# Patient Record
Sex: Male | Born: 1955 | Race: Black or African American | Hispanic: No | Marital: Single | Smoking: Current every day smoker
Health system: Southern US, Community
[De-identification: ages and names within clinical notes are randomized; demographics above are authoritative.]

## PROBLEM LIST (undated history)

## (undated) DIAGNOSIS — R51 Headache: Secondary | ICD-10-CM

## (undated) DIAGNOSIS — R519 Headache, unspecified: Secondary | ICD-10-CM

---

## 2015-02-26 ENCOUNTER — Inpatient Hospital Stay
Admission: AD | Admit: 2015-02-26 | Discharge: 2015-03-05 | DRG: 871 | Payer: Self-pay | Source: Ambulatory Visit | Attending: Internal Medicine | Admitting: Internal Medicine

## 2015-02-26 ENCOUNTER — Ambulatory Visit
Admit: 2015-02-26 | Discharge: 2015-02-26 | Disposition: A | Discharge status: Home / Self Care | Payer: Medicaid Other | Attending: *Deleted | Admitting: *Deleted

## 2015-02-26 ENCOUNTER — Ambulatory Visit
Admission: EM | Admit: 2015-02-26 | Discharge: 2015-02-26 | Disposition: A | Discharge status: Home / Self Care | Payer: Medicaid Other | Attending: Family Medicine | Admitting: Family Medicine

## 2015-02-26 ENCOUNTER — Ambulatory Visit: Payer: Medicaid Other

## 2015-02-26 DIAGNOSIS — Z79899 Other long term (current) drug therapy: Secondary | ICD-10-CM

## 2015-02-26 DIAGNOSIS — E871 Hypo-osmolality and hyponatremia: Secondary | ICD-10-CM | POA: Diagnosis present

## 2015-02-26 DIAGNOSIS — Z6825 Body mass index (BMI) 25.0-25.9, adult: Secondary | ICD-10-CM

## 2015-02-26 DIAGNOSIS — F17210 Nicotine dependence, cigarettes, uncomplicated: Secondary | ICD-10-CM | POA: Insufficient documentation

## 2015-02-26 DIAGNOSIS — M1612 Unilateral primary osteoarthritis, left hip: Secondary | ICD-10-CM | POA: Diagnosis present

## 2015-02-26 DIAGNOSIS — T148XXA Other injury of unspecified body region, initial encounter: Secondary | ICD-10-CM

## 2015-02-26 DIAGNOSIS — X58XXXA Exposure to other specified factors, initial encounter: Secondary | ICD-10-CM | POA: Insufficient documentation

## 2015-02-26 DIAGNOSIS — Z95828 Presence of other vascular implants and grafts: Secondary | ICD-10-CM

## 2015-02-26 DIAGNOSIS — T148 Other injury of unspecified body region: Secondary | ICD-10-CM

## 2015-02-26 DIAGNOSIS — R1902 Left upper quadrant abdominal swelling, mass and lump: Secondary | ICD-10-CM

## 2015-02-26 DIAGNOSIS — S29011A Strain of muscle and tendon of front wall of thorax, initial encounter: Secondary | ICD-10-CM | POA: Insufficient documentation

## 2015-02-26 DIAGNOSIS — Z79891 Long term (current) use of opiate analgesic: Secondary | ICD-10-CM

## 2015-02-26 DIAGNOSIS — E43 Unspecified severe protein-calorie malnutrition: Secondary | ICD-10-CM | POA: Diagnosis present

## 2015-02-26 DIAGNOSIS — D72829 Elevated white blood cell count, unspecified: Secondary | ICD-10-CM

## 2015-02-26 DIAGNOSIS — G43909 Migraine, unspecified, not intractable, without status migrainosus: Secondary | ICD-10-CM | POA: Diagnosis present

## 2015-02-26 DIAGNOSIS — A4151 Sepsis due to Escherichia coli [E. coli]: Principal | ICD-10-CM | POA: Diagnosis present

## 2015-02-26 DIAGNOSIS — Z452 Encounter for adjustment and management of vascular access device: Secondary | ICD-10-CM

## 2015-02-26 DIAGNOSIS — R634 Abnormal weight loss: Secondary | ICD-10-CM | POA: Diagnosis present

## 2015-02-26 DIAGNOSIS — K651 Peritoneal abscess: Secondary | ICD-10-CM | POA: Diagnosis present

## 2015-02-26 DIAGNOSIS — F1020 Alcohol dependence, uncomplicated: Secondary | ICD-10-CM | POA: Diagnosis present

## 2015-02-26 DIAGNOSIS — I499 Cardiac arrhythmia, unspecified: Secondary | ICD-10-CM | POA: Diagnosis present

## 2015-02-26 DIAGNOSIS — Z791 Long term (current) use of non-steroidal anti-inflammatories (NSAID): Secondary | ICD-10-CM

## 2015-02-26 DIAGNOSIS — E860 Dehydration: Secondary | ICD-10-CM | POA: Diagnosis present

## 2015-02-26 DIAGNOSIS — D649 Anemia, unspecified: Secondary | ICD-10-CM | POA: Diagnosis present

## 2015-02-26 DIAGNOSIS — K862 Cyst of pancreas: Secondary | ICD-10-CM | POA: Diagnosis present

## 2015-02-26 DIAGNOSIS — R1900 Intra-abdominal and pelvic swelling, mass and lump, unspecified site: Secondary | ICD-10-CM | POA: Diagnosis present

## 2015-02-26 DIAGNOSIS — R109 Unspecified abdominal pain: Secondary | ICD-10-CM | POA: Insufficient documentation

## 2015-02-26 HISTORY — DX: Headache: R51

## 2015-02-26 HISTORY — DX: Headache, unspecified: R51.9

## 2015-02-26 LAB — CBC WITH DIFFERENTIAL/PLATELET
Basophils Absolute: 0 10*3/uL (ref 0–0.1)
Basophils Relative: 0 %
EOS ABS: 0 10*3/uL (ref 0–0.7)
EOS PCT: 0 %
HCT: 37.9 % — ABNORMAL LOW (ref 40.0–52.0)
Hemoglobin: 12.5 g/dL — ABNORMAL LOW (ref 13.0–18.0)
LYMPHS ABS: 0.7 10*3/uL — AB (ref 1.0–3.6)
LYMPHS PCT: 4 %
MCH: 32.7 pg (ref 26.0–34.0)
MCHC: 33 g/dL (ref 32.0–36.0)
MCV: 99.2 fL (ref 80.0–100.0)
MONO ABS: 1.5 10*3/uL — AB (ref 0.2–1.0)
Monocytes Relative: 9 %
Neutro Abs: 14.9 10*3/uL — ABNORMAL HIGH (ref 1.4–6.5)
Neutrophils Relative %: 87 %
PLATELETS: 455 10*3/uL — AB (ref 150–440)
RBC: 3.82 MIL/uL — ABNORMAL LOW (ref 4.40–5.90)
RDW: 13.1 % (ref 11.5–14.5)
WBC: 17.2 10*3/uL — ABNORMAL HIGH (ref 3.8–10.6)

## 2015-02-26 LAB — URINALYSIS COMPLETE WITH MICROSCOPIC (ARMC ONLY)
Bacteria, UA: NONE SEEN — AB
PH: 0 — AB (ref 5.0–8.0)

## 2015-02-26 LAB — COMPREHENSIVE METABOLIC PANEL
ALT: 16 U/L — ABNORMAL LOW (ref 17–63)
ANION GAP: 13 (ref 5–15)
AST: 24 U/L (ref 15–41)
Albumin: 3.1 g/dL — ABNORMAL LOW (ref 3.5–5.0)
Alkaline Phosphatase: 90 U/L (ref 38–126)
BUN: 9 mg/dL (ref 6–20)
CHLORIDE: 91 mmol/L — AB (ref 101–111)
CO2: 27 mmol/L (ref 22–32)
CREATININE: 0.92 mg/dL (ref 0.61–1.24)
Calcium: 9.5 mg/dL (ref 8.9–10.3)
Glucose, Bld: 144 mg/dL — ABNORMAL HIGH (ref 65–99)
POTASSIUM: 4.4 mmol/L (ref 3.5–5.1)
Sodium: 131 mmol/L — ABNORMAL LOW (ref 135–145)
Total Bilirubin: 1.8 mg/dL — ABNORMAL HIGH (ref 0.3–1.2)
Total Protein: 7.2 g/dL (ref 6.5–8.1)

## 2015-02-26 LAB — LIPASE, BLOOD: LIPASE: 55 U/L — AB (ref 22–51)

## 2015-02-26 MED ORDER — IOHEXOL 300 MG/ML  SOLN
100.0000 mL | Freq: Once | INTRAMUSCULAR | Status: AC | PRN
  Administered 2015-02-26: 100 mL via INTRAVENOUS

## 2015-02-26 MED ORDER — SODIUM CHLORIDE 0.9 % IV SOLN
INTRAVENOUS | Status: DC
  Administered 2015-02-26 – 2015-03-01 (×7): via INTRAVENOUS

## 2015-02-26 MED ORDER — ONDANSETRON HCL 4 MG PO TABS: 4.0000 mg | ORAL_TABLET | Freq: Four times a day (QID) | ORAL | Status: DC | PRN

## 2015-02-26 MED ORDER — MORPHINE SULFATE (PF) 4 MG/ML IV SOLN
4.0000 mg | INTRAVENOUS | Status: DC | PRN
  Administered 2015-02-27 – 2015-03-03 (×11): 4 mg via INTRAVENOUS
  Filled 2015-02-26 (×11): qty 1

## 2015-02-26 MED ORDER — PIPERACILLIN-TAZOBACTAM 3.375 G IVPB
3.3750 g | Freq: Three times a day (TID) | INTRAVENOUS | Status: DC
  Administered 2015-02-26 – 2015-03-02 (×12): 3.375 g via INTRAVENOUS
  Filled 2015-02-26 (×17): qty 50

## 2015-02-26 MED ORDER — OXYCODONE-ACETAMINOPHEN 5-325 MG PO TABS: 1.0000 | ORAL_TABLET | Freq: Three times a day (TID) | ORAL | Status: AC | PRN

## 2015-02-26 MED ORDER — HEPARIN SODIUM (PORCINE) 5000 UNIT/ML IJ SOLN
5000.0000 [IU] | Freq: Three times a day (TID) | INTRAMUSCULAR | Status: AC
  Administered 2015-02-26 – 2015-03-02 (×12): 5000 [IU] via SUBCUTANEOUS
  Filled 2015-02-26 (×12): qty 1

## 2015-02-26 MED ORDER — PANTOPRAZOLE SODIUM 40 MG IV SOLR
40.0000 mg | Freq: Two times a day (BID) | INTRAVENOUS | Status: DC
Start: 2015-02-26 — End: 2015-03-01
  Administered 2015-02-26 – 2015-03-01 (×6): 40 mg via INTRAVENOUS
  Filled 2015-02-26 (×6): qty 40

## 2015-02-26 MED ORDER — ONDANSETRON HCL 4 MG/2ML IJ SOLN: 4.0000 mg | Freq: Four times a day (QID) | INTRAMUSCULAR | Status: DC | PRN

## 2015-02-26 MED ORDER — PIPERACILLIN-TAZOBACTAM 3.375 G IVPB 30 MIN: 3.3750 g | Freq: Three times a day (TID) | INTRAVENOUS | Status: DC
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
## 2015-02-26 NOTE — Consult Note (Signed)
Patient ID: William Marquez, male   DOB: 1956/01/02, 59 y.o.   MRN: [id number]  HPI William Marquez is a 59 y.o. male admitted with 2-4 weeks of abdominal pain.  HPI he was in his usual state of good health until approximately 4 weeks ago when he began to develop some mild anorexia early satiety and left upper quadrant abdominal pain. He denies any nausea or vomiting. He does not complaints of fever or chills. He did have some mild diarrhea which resolved without intervention. He presented to the urgent care facility where CT scan was recommended. Scan identified a significant left upper quadrant mass and patient was referred to the hospital for direct admission. He's been seen by gastroenterology and internal medicine.  He does have a reasonably significant drinking history. He's not had any previous abdominal surgery. He denies any history of hepatitis yellow jaundice pancreatitis peptic ulcer disease gallbladder disease or diverticulitis. He does take nonsteroidal medications regularly. He's never had an endoscopy. He does not have a regular family practice doctor and has not been seen in a long time. He's never had a colonoscopy.  History reviewed. No pertinent past medical history.  History reviewed. No pertinent past surgical history.  History reviewed. No pertinent family history.  Social History Social History  Substance Use Topics  . Smoking status: Current Every Day Smoker  . Smokeless tobacco: None  . Alcohol Use: Yes    No Known Allergies  Current Facility-Administered Medications  Medication Dose Route Frequency Provider Last Rate Last Dose  . 0.9 %  sodium chloride infusion   Intravenous Continuous Katharina Caper, MD 125 mL/hr at 02/26/15 1727    . heparin injection 5,000 Units  5,000 Units Subcutaneous 3 times per day Katharina Caper, MD   5,000 Units at 02/26/15 1726  . morphine 4 MG/ML injection 4 mg  4 mg Intravenous Q4H PRN Katharina Caper, MD      . ondansetron (ZOFRAN) tablet 4  mg  4 mg Oral Q6H PRN Katharina Caper, MD       Or  . ondansetron (ZOFRAN) injection 4 mg  4 mg Intravenous Q6H PRN Katharina Caper, MD      . pantoprazole (PROTONIX) injection 40 mg  40 mg Intravenous Q12H Katharina Caper, MD      . piperacillin-tazobactam (ZOSYN) IVPB 3.375 g  3.375 g Intravenous 3 times per day Katharina Caper, MD   3.375 g at 02/26/15 1808     Review of Systems A 10 point review of systems was asked and was negative except for the following positive findings: Findings noted above.  Physical Exam Blood pressure 146/69, pulse 94, temperature 98.3 F (36.8 C), temperature source Oral, resp. rate 19, height  (1.753 m), weight 78.019 kg (172 lb), SpO2 98 %. CONSTITUTIONAL: He appears comfortable at rest with no significant complaints. He does not have any pain at the present time.Marland Kitchen EYES: Pupils are equal, round, and reactive to light, Sclera are non-icteric. EARS, NOSE, MOUTH AND THROAT: The oropharynx is clear. The oral mucosa is pink and moist. Hearing is intact to voice. LYMPH NODES:  Lymph nodes in the neck are normal. RESPIRATORY:  Lungs are clear. There is normal respiratory effort, with equal breath sounds bilaterally, and without pathologic use of accessory muscles. CARDIOVASCULAR: Heart is regular without murmurs, gallops, or rubs. GI: The abdomen is soft , nontender, and nondistended. There are no palpable masses but he does have some fullness in the left upper quadrant. I cannot palpate a specific mass.  There is no hepatosplenomegaly. There are normal bowel sounds in all quadrants. GU: Rectal deferred.   MUSCULOSKELETAL: Normal muscle strength and tone. No cyanosis or edema.   SKIN: Turgor is good and there are no pathologic skin lesions or ulcers. NEUROLOGIC: Motor and sensation is grossly normal. Cranial nerves are grossly intact. PSYCH:  Oriented to person, place and time. Affect is normal.  Data Reviewed I've independently reviewed the patient's CT scan  laboratory findings. I have personally reviewed the patient's imaging, laboratory findings and medical records.    Assessment    He does have significant mass in left upper quadrant on CT scan. It is quite irregular appears to be encompassing a portion of the greater curvature. The pancreas looks almost pristine till the tail dissolves in the midst of this mass. The spleen and liver did not appear to be involved. There does not appear to be any significant adenopathy noted. I am concerned that the mass involves greater curvature and into the antrum of the stomach on the CT scan. Possible etiologies include pancreatitis with pancreatic pseudocyst, gastric lymphoma, gastric neoplasm, gastric abscess or lesser sac abscess.    Plan    He is currently set up for endoscopy tomorrow. I think that procedure is reasonable place to start. I do not see any indication for urgent surgery. Percutaneous biopsy may be of advantage if the endoscopy does not yield useful information with regard to the diagnosis. I discussed this plan with the patient and he is in agreement.     Time spent with the patient was 45 minutes, with more than 50% of the time spent in face-to-face education, counseling and care coordination.     Tiney Rouge III 02/26/2015, 8:15 PM

## 2015-02-26 NOTE — Discharge Instructions (Signed)
Take medication as prescribed. Rest. Avoid strenuous activity. Go directly to [facility] for the Ct abdomen as discussed.   Follow up with your primary care physician closely. Follow up this week.   Return to Urgent care for new or worsening concerns.   Abdominal Pain Many things can cause abdominal pain. Usually, abdominal pain is not caused by a disease and will improve without treatment. It can often be observed and treated at home. Your health care provider will do a physical exam and possibly order blood tests and X-rays to help determine the seriousness of your pain. However, in many cases, more time must pass before a clear cause of the pain can be found. Before that point, your health care provider may not know if you need more testing or further treatment. HOME CARE INSTRUCTIONS  Monitor your abdominal pain for any changes. The following actions may help to alleviate any discomfort you are experiencing:  Only take over-the-counter or prescription medicines as directed by your health care provider.  Do not take laxatives unless directed to do so by your health care provider.  Try a clear liquid diet (broth, tea, or water) as directed by your health care provider. Slowly move to a bland diet as tolerated. SEEK MEDICAL CARE IF:  You have unexplained abdominal pain.  You have abdominal pain associated with nausea or diarrhea.  You have pain when you urinate or have a bowel movement.  You experience abdominal pain that wakes you in the night.  You have abdominal pain that is worsened or improved by eating food.  You have abdominal pain that is worsened with eating fatty foods.  You have a fever. SEEK IMMEDIATE MEDICAL CARE IF:   Your pain does not go away within 2 hours.  You keep throwing up (vomiting).  Your pain is felt only in portions of the abdomen, such as the right side or the left lower portion of the abdomen.  You pass bloody or black tarry stools. MAKE  SURE YOU:  Understand these instructions.   Will watch your condition.   Will get help right away if you are not doing well or get worse.  Document Released: 02/24/2005 Document Revised: 05/22/2013 Document Reviewed: 01/24/2013 [facility] Patient Information 2015 [place], [place]. This information is not intended to replace advice given to you by your health care provider. Make sure you discuss any questions you have with your health care provider.   Muscle Strain A muscle strain (pulled muscle) happens when a muscle is stretched beyond normal length. It happens when a sudden, violent force stretches your muscle too far. Usually, a few of the fibers in your muscle are torn. Muscle strain is common in athletes. Recovery usually takes 1-2 weeks. Complete healing takes 5-6 weeks.  HOME CARE   Follow the PRICE method of treatment to help your injury get better. Do this the first 2-3 days after the injury:  Protect. Protect the muscle to keep it from getting injured again.  Rest. Limit your activity and rest the injured body part.  Ice. Put ice in a plastic bag. Place a towel between your skin and the bag. Then, apply the ice and leave it on from 15-20 minutes each hour. After the third day, switch to moist heat packs.  Compression. Use a splint or elastic bandage on the injured area for comfort. Do not put it on too tightly.  Elevate. Keep the injured body part above the level of your heart.  Only take medicine as  told by your doctor.  Warm up before doing exercise to prevent future muscle strains. GET HELP IF:   You have more pain or puffiness (swelling) in the injured area.  You feel numbness, tingling, or notice a loss of strength in the injured area. MAKE SURE YOU:   Understand these instructions.  Will watch your condition.  Will get help right away if you are not doing well or get worse. Document Released: 02/24/2008 Document Revised: 03/07/2013 Document Reviewed:  12/14/2012 [facility] Patient Information 2015 [place], [place]. This information is not intended to replace advice given to you by your health care provider. Make sure you discuss any questions you have with your health care provider.

## 2015-02-26 NOTE — Consult Note (Signed)
[facility]  [address] [place], [place] [address] Phone: [phone] Fax : [phone]  Consultation  Referring Provider:     No ref. provider found Primary Care Physician:  No PCP Per Patient Primary Gastroenterologist:  None         Reason for Consultation:     Abdominal mass  Date of Admission:  02/26/2015 Date of Consultation:  02/26/2015         HPI:   William Marquez is a 59 y.o. male who was admitted with abdominal pains. The patient went and had a CT scan of the abdomen that showed a complex lesion surrounding the antrum of the stomach. The patient has a history of drinking to 16 ounces of beer a day. The patient states he has been doing this for very long time. The patient also reports that he has not seen a doctor since he was incarcerated years ago. The patient has a known irregular heartbeat and suffers from migraine headaches. The patient has been on ibuprofen regularly. The patient also reports that his lost 25 pounds in the last month. He also reports approximate 2 weeks ago he had one episode of diarrhea but has not had any diarrhea since.  History reviewed. No pertinent past medical history.  History reviewed. No pertinent past surgical history.  Prior to Admission medications   Medication Sig Start Date End Date Taking? Authorizing Provider  ibuprofen (ADVIL,MOTRIN) 200 MG tablet Take 200 mg by mouth every 6 (six) hours as needed.    Historical Provider, MD  oxyCODONE-acetaminophen (ROXICET) 5-325 MG tablet Take 1 tablet by mouth every 8 (eight) hours as needed for moderate pain or severe pain (Do not drive or operate heavy machinery while taking as can cause drowsiness.). 02/26/15   Renford Dills, NP    History reviewed. No pertinent family history.   Social History  Substance Use Topics  . Smoking status: Current Every Day Smoker  . Smokeless tobacco: None  . Alcohol Use: Yes    Allergies as of 02/26/2015  . (No Known Allergies)     Review of Systems:    All systems reviewed and negative except where noted in HPI.   Physical Exam:  Vital signs in last 24 hours: Temp:  [98.3 F (36.8 C)-98.5 F (36.9 C)] 98.3 F (36.8 C) (09/28 1607) Pulse Rate:  [94-98] 94 (09/28 1607) Resp:  [18-19] 19 (09/28 1607) BP: (132-146)/(63-69) 146/69 mmHg (09/28 1607) SpO2:  [98 %-99 %] 98 % (09/28 1607) Weight:  [172 lb (78.019 kg)-175 lb (79.379 kg)] 172 lb (78.019 kg) (09/28 1607)   General:   Pleasant, cooperative in NAD Head:  Normocephalic and atraumatic. Eyes:   No icterus.   Conjunctiva pink. PERRLA. Ears:  Normal auditory acuity. Neck:  Supple; no masses or thyroidomegaly Lungs: Respirations even and unlabored. Lungs clear to auscultation bilaterally.   No wheezes, crackles, or rhonchi.  Heart:  Regular rate and rhythm;  Without murmur, clicks, rubs or gallops Abdomen:  Soft, nondistended, nontender. Normal bowel sounds. No hepatomegaly. There was a palpable mass on the left upper abdomen..  No rebound or guarding.  Rectal:  Not performed. Msk:  Symmetrical without gross deformities.  Strength  Extremities:  Without edema, cyanosis or clubbing. Neurologic:  Alert and oriented x3;  grossly normal neurologically. Skin:  Intact without significant lesions or rashes. Cervical Nodes:  No significant cervical adenopathy. Psych:  Alert and cooperative. Normal affect.  LAB RESULTS:  Recent Labs  02/26/15 0956  WBC  17.2*  HGB 12.5*  HCT 37.9*  PLT 455*   BMET  Recent Labs  02/26/15 0956  NA 131*  K 4.4  CL 91*  CO2 27  GLUCOSE 144*  BUN 9  CREATININE 0.92  CALCIUM 9.5   LFT  Recent Labs  02/26/15 0956  PROT 7.2  ALBUMIN 3.1*  AST 24  ALT 16*  ALKPHOS 90  BILITOT 1.8*   PT/INR No results for input(s): LABPROT, INR in the last 72 hours.  STUDIES: Dg Ribs Unilateral W/chest Left  02/26/2015   CLINICAL DATA:  Left anterior rib pain after lifting television on Sunday. Worse with deep breathing  and cough.  EXAM: LEFT RIBS AND CHEST - 3+ VIEW  COMPARISON:  None.  FINDINGS: Scarring in the left apex with apical bulla. Lungs otherwise clear. No effusions or pneumothorax. Heart is normal size.  No acute bony abnormality.  No visible rib fracture.  IMPRESSION: Left apical bulla and scarring.  No acute findings.   Electronically Signed   By: Charlett Nose M.D.   On: 02/26/2015 10:30   Ct Chest W Contrast  02/26/2015   CLINICAL DATA:  Epigastric pain, left lower quadrant pain, left upper quadrant pain, elevated WBC  EXAM: CT CHEST, ABDOMEN, AND PELVIS WITH CONTRAST  TECHNIQUE: Multidetector CT imaging of the chest, abdomen and pelvis was performed following the standard protocol during bolus administration of intravenous contrast.  CONTRAST:  OMNIPAQUE IOHEXOL 300 MG/ML  SOLN  COMPARISON:  None.  FINDINGS: CT CHEST FINDINGS  Mediastinum/Nodes: No mediastinal hematoma. No lymphadenopathy. Normal caliber thoracic aorta. Normal heart size.  Lungs/Pleura: Bilateral centrilobular emphysema. No focal consolidation, pleural effusion or pneumothorax.  Musculoskeletal: No acute osseous abnormality. No aggressive lytic or sclerotic osseous lesion. Maintained vertebral body heights and normal anatomic alignment.  CT ABDOMEN PELVIS FINDINGS  Hepatobiliary: Decreased hepatic echogenicity as can be seen with hepatic steatosis. More focal area of low attenuation in the falciform ligament measuring 2 cm likely reflecting a more focal area of steatosis. No intrahepatic or extrahepatic biliary ductal dilatation.  Pancreas: Pancreas enhances normally and homogeneously.  Spleen: Normal.  Adrenals/Urinary Tract: Normal adrenal glands. Normal kidneys. No urolithiasis or obstructive uropathy. Normal bladder.  Stomach/Bowel: No bowel wall thickening or dilatation. No abdominal or pelvic free fluid. Normal appendix. No pneumatosis, pneumoperitoneum or portal venous gas.  Vascular/Lymphatic: Normal caliber abdominal aorta with  atherosclerosis. Numerous small non pathologically enlarged retroperitoneal lymph nodes with the largest measuring 8 mm in short axis along the left para-aortic region.  Prostate: Enlarged prostate gland measuring 5.7 x 3.6 cm.  Other: There is a complex multiloculated cystic mass in the left upper quadrant measuring 14.6 x 6.3 x 7.1 cm with peripheral enhancement. There is a component which partially encases the gastric antrum.  Musculoskeletal: No acute osseous abnormality. Mild osteoarthritis of the left hip. No aggressive lytic or sclerotic osseous lesion. Degenerative disc disease with disc height loss and bilateral facet arthropathy at L5-S1.  IMPRESSION: 1. Complex multiloculated cystic mass in the left upper quadrant measuring 14.6 x 6.3 x 7.1 cm with mild peripheral enhancement. There is a component which partially encases the gastric antrum. The appearance is nonspecific and may reflect a pseudocyst versus multiloculated abscess versus cystic malignancy versus sequela of perforated ulcer. Percutaneous fluid sampling would L delineate between these etiologies. 2. Hepatic steatosis.   Electronically Signed   By: Elige Ko   On: 02/26/2015 14:34   Ct Abdomen Pelvis W Contrast  02/26/2015   CLINICAL  DATA:  Epigastric pain, left lower quadrant pain, left upper quadrant pain, elevated WBC  EXAM: CT CHEST, ABDOMEN, AND PELVIS WITH CONTRAST  TECHNIQUE: Multidetector CT imaging of the chest, abdomen and pelvis was performed following the standard protocol during bolus administration of intravenous contrast.  CONTRAST:  OMNIPAQUE IOHEXOL 300 MG/ML  SOLN  COMPARISON:  None.  FINDINGS: CT CHEST FINDINGS  Mediastinum/Nodes: No mediastinal hematoma. No lymphadenopathy. Normal caliber thoracic aorta. Normal heart size.  Lungs/Pleura: Bilateral centrilobular emphysema. No focal consolidation, pleural effusion or pneumothorax.  Musculoskeletal: No acute osseous abnormality. No aggressive lytic or sclerotic  osseous lesion. Maintained vertebral body heights and normal anatomic alignment.  CT ABDOMEN PELVIS FINDINGS  Hepatobiliary: Decreased hepatic echogenicity as can be seen with hepatic steatosis. More focal area of low attenuation in the falciform ligament measuring 2 cm likely reflecting a more focal area of steatosis. No intrahepatic or extrahepatic biliary ductal dilatation.  Pancreas: Pancreas enhances normally and homogeneously.  Spleen: Normal.  Adrenals/Urinary Tract: Normal adrenal glands. Normal kidneys. No urolithiasis or obstructive uropathy. Normal bladder.  Stomach/Bowel: No bowel wall thickening or dilatation. No abdominal or pelvic free fluid. Normal appendix. No pneumatosis, pneumoperitoneum or portal venous gas.  Vascular/Lymphatic: Normal caliber abdominal aorta with atherosclerosis. Numerous small non pathologically enlarged retroperitoneal lymph nodes with the largest measuring 8 mm in short axis along the left para-aortic region.  Prostate: Enlarged prostate gland measuring 5.7 x 3.6 cm.  Other: There is a complex multiloculated cystic mass in the left upper quadrant measuring 14.6 x 6.3 x 7.1 cm with peripheral enhancement. There is a component which partially encases the gastric antrum.  Musculoskeletal: No acute osseous abnormality. Mild osteoarthritis of the left hip. No aggressive lytic or sclerotic osseous lesion. Degenerative disc disease with disc height loss and bilateral facet arthropathy at L5-S1.  IMPRESSION: 1. Complex multiloculated cystic mass in the left upper quadrant measuring 14.6 x 6.3 x 7.1 cm with mild peripheral enhancement. There is a component which partially encases the gastric antrum. The appearance is nonspecific and may reflect a pseudocyst versus multiloculated abscess versus cystic malignancy versus sequela of perforated ulcer. Percutaneous fluid sampling would L delineate between these etiologies. 2. Hepatic steatosis.   Electronically Signed   By: Elige Ko    On: 02/26/2015 14:34      Impression / Plan:   Jamaris Theard is a 60 y.o. y/o male with who was admitted with weight loss and abdominal pain. The patient's CT scan showed a possible mass around versus intragastric. The patient will be set up for an EGD for tomorrow. The patient may have a pseudocyst involving his pancreas is the cause of his cystic lesion versus a neoplasm. The patient has been explained the plan and agrees with it.I have discussed risks & benefits which include, but are not limited to, bleeding, infection, perforation & drug reaction.  The patient agrees with this plan & written consent will be obtained.      Thank you for involving me in the care of this patient.      LOS: 0 days   Darlina Rumpf, MD  02/26/2015, 5:03 PM   Note: This dictation was prepared with Dragon dictation along with smaller phrase technology. Any transcriptional errors that result from this process are unintentional.

## 2015-02-26 NOTE — ED Notes (Signed)
Pt states "I pulled a muscle lifting a heavy TV on Sunday. The muscles over my left ribs are very painful. It is painful to cough of deep breath."

## 2015-02-26 NOTE — Plan of Care (Signed)
Problem: Discharge Progression Outcomes Goal: Discharge plan in place and appropriate Outcome: Progressing Patient goes by William Marquez expects to be discharged to home  Goal: Pain controlled with appropriate interventions Outcome: Progressing Patient had no c/o pain this shift, pain relieved in ED with Percocet per patient  Goal: Hemodynamically stable Outcome: Progressing VSS Goal: Complications resolved/controlled Outcome: Progressing No bleeding noted this shift, no c/o ABD pain Hemo of 12.5 Goal: Tolerating diet Outcome: Progressing Patient is tolerating diet well  Goal: Activity appropriate for discharge plan Patient is a stand by assist to bathroom

## 2015-02-26 NOTE — H&P (Signed)
[facility] - [place] at [facility]   PATIENT NAME: William Marquez    MR#:  [id number]  DATE OF BIRTH:  [birth year]  DATE OF ADMISSION:  02/26/2015  PRIMARY CARE PHYSICIAN: No PCP Per Patient   REQUESTING/REFERRING PHYSICIAN:   CHIEF COMPLAINT:  No chief complaint on file.   HISTORY OF PRESENT ILLNESS: William Marquez  is a 59 y.o. male with a known history of irregular heartbeat since childhood, also migraine headaches on heavy ibuprofen therapy for the past 2 years who comes to the hospital with complaints of abdominal pains. When the patient, he started having abdominal pain in the lower abdomen. Pain was described as sharp gassy looking pain, intermittent. There is no alleviating or aggravating factors. Few days ago. 3 days ago he lifted TV and Saturday having left upper quadrant abdominal pain, very sharp, increasing with breathing, moving all some certain positions. He felt very uncomfortable even had some difficulty taking deep breaths because of pain and presented to urgent care center for further evaluation. In the urgent care center. He was noted to have elevated white blood cell count and CT scan of abdomen and pelvis as well as chest was performed which revealed left upper quadrant abdominal mass. Patient was sent as direct admit.  Hospitalist services were contacted for admission. Patient denies any fevers or chills. However, admits of having severe diarrhea approximately 2 weeks ago , patient's wife, however , things that patient is intermittently hot. She is very concerned about his weight loss which happened over the past one month. Apparently patient lost about 20-25 pounds. His appetite is also not as good as it was in the past.   PAST MEDICAL HISTORY:  Migraine headaches, irregular heartbeats since childhood  PAST SURGICAL HISTORY: None .  SOCIAL HISTORY:  Social History  Substance Use Topics  . Smoking status: Current Every Day Smoker  . Smokeless  tobacco: Not on file  . Alcohol Use: Yes    FAMILY HISTORY: No family history on file.  DRUG ALLERGIES: No Known Allergies  Review of Systems  Constitutional: Positive for weight loss and malaise/fatigue. Negative for fever and chills.  HENT: Negative for congestion.   Eyes: Negative for blurred vision and double vision.  Respiratory: Positive for cough and sputum production. Negative for shortness of breath and wheezing.   Cardiovascular: Positive for palpitations. Negative for chest pain, orthopnea, leg swelling and PND.  Gastrointestinal: Positive for nausea, vomiting, abdominal pain and constipation. Negative for diarrhea, blood in stool and melena.  Genitourinary: Negative for dysuria, urgency, frequency and hematuria.  Musculoskeletal: Negative for falls.  Skin: Negative for rash.  Neurological: Positive for weakness and headaches. Negative for dizziness.  Psychiatric/Behavioral: Negative for depression and memory loss. The patient is not nervous/anxious.     MEDICATIONS AT HOME:  Prior to Admission medications   Medication Sig Start Date End Date Taking? Authorizing Provider  ibuprofen (ADVIL,MOTRIN) 200 MG tablet Take 200 mg by mouth every 6 (six) hours as needed.    Historical Provider, MD  oxyCODONE-acetaminophen (ROXICET) 5-325 MG tablet Take 1 tablet by mouth every 8 (eight) hours as needed for moderate pain or severe pain (Do not drive or operate heavy machinery while taking as can cause drowsiness.). 02/26/15   Renford Dills, NP      PHYSICAL EXAMINATION:   VITAL SIGNS: Blood pressure 146/69, pulse 94, temperature 98.3 F (36.8 C), temperature source Oral, resp. rate 19, height 5\' 9"  (1.753 m), weight 78.019 kg (172 lb), SpO2  98 %.  GENERAL:  59 y.o.-year-old patient lying in the bed with no acute distress.  EYES: Pupils equal, round, reactive to light and accommodation. No scleral icterus. Extraocular muscles intact.  HEENT: Head atraumatic, normocephalic.  Oropharynx and nasopharynx clear.  NECK:  Supple, no jugular venous distention. No thyroid enlargement, no tenderness.  LUNGS: Normal breath sounds bilaterally, no wheezing, rales,rhonchi or crepitation. No use of accessory muscles of respiration.  CARDIOVASCULAR: S1, S2 normal. No murmurs, rubs, or gallops.  ABDOMEN: Soft, acutely tender mostly on the left side , guarding in the suprapubic area extending to left upper quadrant , no rebound. Bowel sounds present. No organomegaly or masses noted /felt on palpation.  EXTREMITIES: No pedal edema, cyanosis, or clubbing.  NEUROLOGIC: Cranial nerves II through XII are intact. Muscle strength 5/5 in all extremities. Sensation intact. Gait not checked.  PSYCHIATRIC: The patient is alert and oriented x 3.  SKIN: No obvious rash, lesion, or ulcer.   LABORATORY PANEL:   CBC  Recent Labs Lab 02/26/15 0956  WBC 17.2*  HGB 12.5*  HCT 37.9*  PLT 455*  MCV 99.2  MCH 32.7  MCHC 33.0  RDW 13.1  LYMPHSABS 0.7*  MONOABS 1.5*  EOSABS 0.0  BASOSABS 0.0   ------------------------------------------------------------------------------------------------------------------  Chemistries   Recent Labs Lab 02/26/15 0956  NA 131*  K 4.4  CL 91*  CO2 27  GLUCOSE 144*  BUN 9  CREATININE 0.92  CALCIUM 9.5  AST 24  ALT 16*  ALKPHOS 90  BILITOT 1.8*   ------------------------------------------------------------------------------------------------------------------  Cardiac Enzymes No results for input(s): TROPONINI in the last 168 hours. ------------------------------------------------------------------------------------------------------------------  RADIOLOGY: Dg Ribs Unilateral W/chest Left  02/26/2015   CLINICAL DATA:  Left anterior rib pain after lifting television on Sunday. Worse with deep breathing and cough.  EXAM: LEFT RIBS AND CHEST - 3+ VIEW  COMPARISON:  None.  FINDINGS: Scarring in the left apex with apical bulla. Lungs otherwise  clear. No effusions or pneumothorax. Heart is normal size.  No acute bony abnormality.  No visible rib fracture.  IMPRESSION: Left apical bulla and scarring.  No acute findings.   Electronically Signed   By: Charlett Nose M.D.   On: 02/26/2015 10:30   Ct Chest W Contrast  02/26/2015   CLINICAL DATA:  Epigastric pain, left lower quadrant pain, left upper quadrant pain, elevated WBC  EXAM: CT CHEST, ABDOMEN, AND PELVIS WITH CONTRAST  TECHNIQUE: Multidetector CT imaging of the chest, abdomen and pelvis was performed following the standard protocol during bolus administration of intravenous contrast.  CONTRAST:  OMNIPAQUE IOHEXOL 300 MG/ML  SOLN  COMPARISON:  None.  FINDINGS: CT CHEST FINDINGS  Mediastinum/Nodes: No mediastinal hematoma. No lymphadenopathy. Normal caliber thoracic aorta. Normal heart size.  Lungs/Pleura: Bilateral centrilobular emphysema. No focal consolidation, pleural effusion or pneumothorax.  Musculoskeletal: No acute osseous abnormality. No aggressive lytic or sclerotic osseous lesion. Maintained vertebral body heights and normal anatomic alignment.  CT ABDOMEN PELVIS FINDINGS  Hepatobiliary: Decreased hepatic echogenicity as can be seen with hepatic steatosis. More focal area of low attenuation in the falciform ligament measuring 2 cm likely reflecting a more focal area of steatosis. No intrahepatic or extrahepatic biliary ductal dilatation.  Pancreas: Pancreas enhances normally and homogeneously.  Spleen: Normal.  Adrenals/Urinary Tract: Normal adrenal glands. Normal kidneys. No urolithiasis or obstructive uropathy. Normal bladder.  Stomach/Bowel: No bowel wall thickening or dilatation. No abdominal or pelvic free fluid. Normal appendix. No pneumatosis, pneumoperitoneum or portal venous gas.  Vascular/Lymphatic: Normal caliber abdominal  aorta with atherosclerosis. Numerous small non pathologically enlarged retroperitoneal lymph nodes with the largest measuring 8 mm in short axis along the  left para-aortic region.  Prostate: Enlarged prostate gland measuring 5.7 x 3.6 cm.  Other: There is a complex multiloculated cystic mass in the left upper quadrant measuring 14.6 x 6.3 x 7.1 cm with peripheral enhancement. There is a component which partially encases the gastric antrum.  Musculoskeletal: No acute osseous abnormality. Mild osteoarthritis of the left hip. No aggressive lytic or sclerotic osseous lesion. Degenerative disc disease with disc height loss and bilateral facet arthropathy at L5-S1.  IMPRESSION: 1. Complex multiloculated cystic mass in the left upper quadrant measuring 14.6 x 6.3 x 7.1 cm with mild peripheral enhancement. There is a component which partially encases the gastric antrum. The appearance is nonspecific and may reflect a pseudocyst versus multiloculated abscess versus cystic malignancy versus sequela of perforated ulcer. Percutaneous fluid sampling would L delineate between these etiologies. 2. Hepatic steatosis.   Electronically Signed   By: Elige Ko   On: 02/26/2015 14:34   Ct Abdomen Pelvis W Contrast  02/26/2015   CLINICAL DATA:  Epigastric pain, left lower quadrant pain, left upper quadrant pain, elevated WBC  EXAM: CT CHEST, ABDOMEN, AND PELVIS WITH CONTRAST  TECHNIQUE: Multidetector CT imaging of the chest, abdomen and pelvis was performed following the standard protocol during bolus administration of intravenous contrast.  CONTRAST:  OMNIPAQUE IOHEXOL 300 MG/ML  SOLN  COMPARISON:  None.  FINDINGS: CT CHEST FINDINGS  Mediastinum/Nodes: No mediastinal hematoma. No lymphadenopathy. Normal caliber thoracic aorta. Normal heart size.  Lungs/Pleura: Bilateral centrilobular emphysema. No focal consolidation, pleural effusion or pneumothorax.  Musculoskeletal: No acute osseous abnormality. No aggressive lytic or sclerotic osseous lesion. Maintained vertebral body heights and normal anatomic alignment.  CT ABDOMEN PELVIS FINDINGS  Hepatobiliary: Decreased hepatic  echogenicity as can be seen with hepatic steatosis. More focal area of low attenuation in the falciform ligament measuring 2 cm likely reflecting a more focal area of steatosis. No intrahepatic or extrahepatic biliary ductal dilatation.  Pancreas: Pancreas enhances normally and homogeneously.  Spleen: Normal.  Adrenals/Urinary Tract: Normal adrenal glands. Normal kidneys. No urolithiasis or obstructive uropathy. Normal bladder.  Stomach/Bowel: No bowel wall thickening or dilatation. No abdominal or pelvic free fluid. Normal appendix. No pneumatosis, pneumoperitoneum or portal venous gas.  Vascular/Lymphatic: Normal caliber abdominal aorta with atherosclerosis. Numerous small non pathologically enlarged retroperitoneal lymph nodes with the largest measuring 8 mm in short axis along the left para-aortic region.  Prostate: Enlarged prostate gland measuring 5.7 x 3.6 cm.  Other: There is a complex multiloculated cystic mass in the left upper quadrant measuring 14.6 x 6.3 x 7.1 cm with peripheral enhancement. There is a component which partially encases the gastric antrum.  Musculoskeletal: No acute osseous abnormality. Mild osteoarthritis of the left hip. No aggressive lytic or sclerotic osseous lesion. Degenerative disc disease with disc height loss and bilateral facet arthropathy at L5-S1.  IMPRESSION: 1. Complex multiloculated cystic mass in the left upper quadrant measuring 14.6 x 6.3 x 7.1 cm with mild peripheral enhancement. There is a component which partially encases the gastric antrum. The appearance is nonspecific and may reflect a pseudocyst versus multiloculated abscess versus cystic malignancy versus sequela of perforated ulcer. Percutaneous fluid sampling would L delineate between these etiologies. 2. Hepatic steatosis.   Electronically Signed   By: Elige Ko   On: 02/26/2015 14:34    EKG: No orders found for this or any previous visit.  IMPRESSION AND PLAN:  Principal Problem:   Abdominal  mass Active Problems:   Leukocytosis 1. Left upper quadrant abdominal mass of unclear etiology, rule out abscess, initiate patient on Zosyn. Get gastroenterologist and surgery involved in a patient's care. Patient has been on high doses of ibuprofen for a few years now, concerning for peptic ulcer disease 2. Hyponatremia, likely dehydration related. Continue patient on IV fluids and follow sodium level in the morning 3. Leukocytosis. Follow with therapy 4. Anemia. Follow with rehydration   All the records are reviewed and case discussed with ED provider. Management plans discussed with the patient, family and they are in agreement.  CODE STATUS:    Code Status Orders        Start     Ordered   02/26/15 1616  Full code   Continuous     02/26/15 1615       TOTAL TIME TAKING CARE OF THIS PATIENT: 55 minutes.    Katharina Caper M.D on 02/26/2015 at 4:44 PM  Between 7am to 6pm - Pager - [phone] After 6pm go to [email or web address] - password [email or web address] [facility]  [place] [facility]  Office  [phone]  CC: Primary care physician; No PCP Per Patient

## 2015-02-27 ENCOUNTER — Inpatient Hospital Stay: Payer: Self-pay | Admitting: Anesthesiology

## 2015-02-27 ENCOUNTER — Encounter
Admission: AD | Disposition: A | Discharge status: Other Acute Inpatient Hospital | Payer: Self-pay | Source: Ambulatory Visit | Attending: Internal Medicine

## 2015-02-27 DIAGNOSIS — E43 Unspecified severe protein-calorie malnutrition: Secondary | ICD-10-CM | POA: Insufficient documentation

## 2015-02-27 HISTORY — PX: ESOPHAGOGASTRODUODENOSCOPY (EGD) WITH PROPOFOL: SHX5813

## 2015-02-27 SURGERY — ESOPHAGOGASTRODUODENOSCOPY (EGD) WITH PROPOFOL
Anesthesia: General

## 2015-02-27 MED ORDER — FENTANYL CITRATE (PF) 100 MCG/2ML IJ SOLN
INTRAMUSCULAR | Status: DC | PRN
  Administered 2015-02-27 (×2): 50 ug via INTRAVENOUS

## 2015-02-27 MED ORDER — MIDAZOLAM HCL 5 MG/ML IJ SOLN
INTRAMUSCULAR | Status: DC | PRN
  Administered 2015-02-27: 1 mg via INTRAVENOUS
  Administered 2015-02-27 (×2): 2 mg via INTRAVENOUS

## 2015-02-27 MED ORDER — MIDAZOLAM HCL 5 MG/5ML IJ SOLN
INTRAMUSCULAR | Status: AC
  Filled 2015-02-27: qty 10

## 2015-02-27 MED ORDER — NICOTINE 21 MG/24HR TD PT24
21.0000 mg | MEDICATED_PATCH | Freq: Every day | TRANSDERMAL | Status: DC
  Filled 2015-02-27 (×3): qty 1

## 2015-02-27 MED ORDER — FENTANYL CITRATE (PF) 100 MCG/2ML IJ SOLN
INTRAMUSCULAR | Status: AC
Start: 2015-02-27 — End: 2015-02-28
  Filled 2015-02-27: qty 4

## 2015-02-27 NOTE — Anesthesia Preprocedure Evaluation (Signed)
Anesthesia Evaluation  Patient identified by MRN, date of birth, ID band Patient awake    Reviewed: Allergy & Precautions, H&P , NPO status , Patient's Chart, lab work & pertinent test results, reviewed documented beta blocker date and time   Airway Mallampati: II  TM Distance: >3 FB Neck ROM: full    Dental no notable dental hx.    Pulmonary neg pulmonary ROS, Current Smoker,    Pulmonary exam normal breath sounds clear to auscultation       Cardiovascular Exercise Tolerance: Good negative cardio ROS   Rhythm:regular Rate:Normal     Neuro/Psych negative neurological ROS  negative psych ROS   GI/Hepatic negative GI ROS, Neg liver ROS,   Endo/Other  negative endocrine ROS  Renal/GU negative Renal ROS  negative genitourinary   Musculoskeletal   Abdominal   Peds  Hematology negative hematology ROS (+)   Anesthesia Other Findings   Reproductive/Obstetrics negative OB ROS                             Anesthesia Physical Anesthesia Plan  ASA: II  Anesthesia Plan: General   Post-op Pain Management:    Induction:   Airway Management Planned:   Additional Equipment:   Intra-op Plan:   Post-operative Plan:   Informed Consent: I have reviewed the patients History and Physical, chart, labs and discussed the procedure including the risks, benefits and alternatives for the proposed anesthesia with the patient or authorized representative who has indicated his/her understanding and acceptance.   Dental Advisory Given  Plan Discussed with: CRNA  Anesthesia Plan Comments:         Anesthesia Quick Evaluation

## 2015-02-27 NOTE — Plan of Care (Signed)
Problem: Discharge Progression Outcomes Goal: Other Discharge Outcomes/Goals Outcome: Progressing Plan of care progress: -continues IV fluids -PRN morphine for pain with relief -EGD today -continues IV ABTs

## 2015-02-27 NOTE — Progress Notes (Signed)
Pt going for EGD, pt with no noted complaints

## 2015-02-27 NOTE — Progress Notes (Signed)
[facility] [place] [facility] [place]   PATIENT NAME: William Marquez    MR#:  [id number]  DATE OF BIRTH:  1956/01/22  SUBJECTIVE:  CHIEF COMPLAINT: Pt is resting comfortably, left abd pain with movements or pressure  REVIEW OF SYSTEMS:  CONSTITUTIONAL: No fever, fatigue or weakness.  EYES: No blurred or double vision.  EARS, NOSE, AND THROAT: No tinnitus or ear pain.  RESPIRATORY: No cough, shortness of breath, wheezing or hemoptysis.  CARDIOVASCULAR: No chest pain, orthopnea, edema.  GASTROINTESTINAL: No nausea, vomiting, diarrhea  LUQ mass and  abdominal pain.  GENITOURINARY: No dysuria, hematuria.  ENDOCRINE: No polyuria, nocturia,  HEMATOLOGY: No anemia, easy bruising or bleeding SKIN: No rash or lesion. MUSCULOSKELETAL: No joint pain or arthritis.   NEUROLOGIC: No tingling, numbness, weakness.  PSYCHIATRY: No anxiety or depression.   DRUG ALLERGIES:  No Known Allergies  VITALS:  Blood pressure 148/74, pulse 92, temperature 97.6 F (36.4 C), temperature source Tympanic, resp. rate 18, height  (1.753 m), weight 78.019 kg (172 lb), SpO2 96 %.  PHYSICAL EXAMINATION:  GENERAL:  59 y.o.-year-old patient lying in the bed with no acute distress.  EYES: Pupils equal, round, reactive to light and accommodation. No scleral icterus. Extraocular muscles intact.  HEENT: Head atraumatic, normocephalic. Oropharynx and nasopharynx clear.  NECK:  Supple, no jugular venous distention. No thyroid enlargement, no tenderness.  LUNGS: Normal breath sounds bilaterally, no wheezing, rales,rhonchi or crepitation. No use of accessory muscles of respiration.  CARDIOVASCULAR: S1, S2 normal. No murmurs, rubs, or gallops.  ABDOMEN: Soft, LUQ is tender, nondistended. Bowel sounds present. LUQ  mass.  EXTREMITIES: No pedal edema, cyanosis, or clubbing.  NEUROLOGIC: Cranial nerves II through XII are intact. Muscle strength 5/5 in all extremities. Sensation intact. Gait not  checked.  PSYCHIATRIC: The patient is alert and oriented x 3.  SKIN: No obvious rash, lesion, or ulcer.    LABORATORY PANEL:   CBC  Recent Labs Lab 02/26/15 0956  WBC 17.2*  HGB 12.5*  HCT 37.9*  PLT 455*   ------------------------------------------------------------------------------------------------------------------  Chemistries   Recent Labs Lab 02/26/15 0956  NA 131*  K 4.4  CL 91*  CO2 27  GLUCOSE 144*  BUN 9  CREATININE 0.92  CALCIUM 9.5  AST 24  ALT 16*  ALKPHOS 90  BILITOT 1.8*   ------------------------------------------------------------------------------------------------------------------  Cardiac Enzymes No results for input(s): TROPONINI in the last 168 hours. ------------------------------------------------------------------------------------------------------------------  RADIOLOGY:  Dg Ribs Unilateral W/chest Left  02/26/2015   CLINICAL DATA:  Left anterior rib pain after lifting television on Sunday. Worse with deep breathing and cough.  EXAM: LEFT RIBS AND CHEST - 3+ VIEW  COMPARISON:  None.  FINDINGS: Scarring in the left apex with apical bulla. Lungs otherwise clear. No effusions or pneumothorax. Heart is normal size.  No acute bony abnormality.  No visible rib fracture.  IMPRESSION: Left apical bulla and scarring.  No acute findings.   Electronically Signed   By: Charlett Nose M.D.   On: 02/26/2015 10:30   Ct Chest W Contrast  02/26/2015   CLINICAL DATA:  Epigastric pain, left lower quadrant pain, left upper quadrant pain, elevated WBC  EXAM: CT CHEST, ABDOMEN, AND PELVIS WITH CONTRAST  TECHNIQUE: Multidetector CT imaging of the chest, abdomen and pelvis was performed following the standard protocol during bolus administration of intravenous contrast.  CONTRAST:  OMNIPAQUE IOHEXOL 300 MG/ML  SOLN  COMPARISON:  None.  FINDINGS: CT CHEST FINDINGS  Mediastinum/Nodes: No mediastinal hematoma. No  lymphadenopathy. Normal caliber thoracic aorta.  Normal heart size.  Lungs/Pleura: Bilateral centrilobular emphysema. No focal consolidation, pleural effusion or pneumothorax.  Musculoskeletal: No acute osseous abnormality. No aggressive lytic or sclerotic osseous lesion. Maintained vertebral body heights and normal anatomic alignment.  CT ABDOMEN PELVIS FINDINGS  Hepatobiliary: Decreased hepatic echogenicity as can be seen with hepatic steatosis. More focal area of low attenuation in the falciform ligament measuring 2 cm likely reflecting a more focal area of steatosis. No intrahepatic or extrahepatic biliary ductal dilatation.  Pancreas: Pancreas enhances normally and homogeneously.  Spleen: Normal.  Adrenals/Urinary Tract: Normal adrenal glands. Normal kidneys. No urolithiasis or obstructive uropathy. Normal bladder.  Stomach/Bowel: No bowel wall thickening or dilatation. No abdominal or pelvic free fluid. Normal appendix. No pneumatosis, pneumoperitoneum or portal venous gas.  Vascular/Lymphatic: Normal caliber abdominal aorta with atherosclerosis. Numerous small non pathologically enlarged retroperitoneal lymph nodes with the largest measuring 8 mm in short axis along the left para-aortic region.  Prostate: Enlarged prostate gland measuring 5.7 x 3.6 cm.  Other: There is a complex multiloculated cystic mass in the left upper quadrant measuring 14.6 x 6.3 x 7.1 cm with peripheral enhancement. There is a component which partially encases the gastric antrum.  Musculoskeletal: No acute osseous abnormality. Mild osteoarthritis of the left hip. No aggressive lytic or sclerotic osseous lesion. Degenerative disc disease with disc height loss and bilateral facet arthropathy at L5-S1.  IMPRESSION: 1. Complex multiloculated cystic mass in the left upper quadrant measuring 14.6 x 6.3 x 7.1 cm with mild peripheral enhancement. There is a component which partially encases the gastric antrum. The appearance is nonspecific and may reflect a pseudocyst versus multiloculated  abscess versus cystic malignancy versus sequela of perforated ulcer. Percutaneous fluid sampling would L delineate between these etiologies. 2. Hepatic steatosis.   Electronically Signed   By: Elige Ko   On: 02/26/2015 14:34   Ct Abdomen Pelvis W Contrast  02/26/2015   CLINICAL DATA:  Epigastric pain, left lower quadrant pain, left upper quadrant pain, elevated WBC  EXAM: CT CHEST, ABDOMEN, AND PELVIS WITH CONTRAST  TECHNIQUE: Multidetector CT imaging of the chest, abdomen and pelvis was performed following the standard protocol during bolus administration of intravenous contrast.  CONTRAST:  OMNIPAQUE IOHEXOL 300 MG/ML  SOLN  COMPARISON:  None.  FINDINGS: CT CHEST FINDINGS  Mediastinum/Nodes: No mediastinal hematoma. No lymphadenopathy. Normal caliber thoracic aorta. Normal heart size.  Lungs/Pleura: Bilateral centrilobular emphysema. No focal consolidation, pleural effusion or pneumothorax.  Musculoskeletal: No acute osseous abnormality. No aggressive lytic or sclerotic osseous lesion. Maintained vertebral body heights and normal anatomic alignment.  CT ABDOMEN PELVIS FINDINGS  Hepatobiliary: Decreased hepatic echogenicity as can be seen with hepatic steatosis. More focal area of low attenuation in the falciform ligament measuring 2 cm likely reflecting a more focal area of steatosis. No intrahepatic or extrahepatic biliary ductal dilatation.  Pancreas: Pancreas enhances normally and homogeneously.  Spleen: Normal.  Adrenals/Urinary Tract: Normal adrenal glands. Normal kidneys. No urolithiasis or obstructive uropathy. Normal bladder.  Stomach/Bowel: No bowel wall thickening or dilatation. No abdominal or pelvic free fluid. Normal appendix. No pneumatosis, pneumoperitoneum or portal venous gas.  Vascular/Lymphatic: Normal caliber abdominal aorta with atherosclerosis. Numerous small non pathologically enlarged retroperitoneal lymph nodes with the largest measuring 8 mm in short axis along the left  para-aortic region.  Prostate: Enlarged prostate gland measuring 5.7 x 3.6 cm.  Other: There is a complex multiloculated cystic mass in the left upper quadrant measuring 14.6 x 6.3 x 7.1  cm with peripheral enhancement. There is a component which partially encases the gastric antrum.  Musculoskeletal: No acute osseous abnormality. Mild osteoarthritis of the left hip. No aggressive lytic or sclerotic osseous lesion. Degenerative disc disease with disc height loss and bilateral facet arthropathy at L5-S1.  IMPRESSION: 1. Complex multiloculated cystic mass in the left upper quadrant measuring 14.6 x 6.3 x 7.1 cm with mild peripheral enhancement. There is a component which partially encases the gastric antrum. The appearance is nonspecific and may reflect a pseudocyst versus multiloculated abscess versus cystic malignancy versus sequela of perforated ulcer. Percutaneous fluid sampling would L delineate between these etiologies. 2. Hepatic steatosis.   Electronically Signed   By: Elige Ko   On: 02/26/2015 14:34    EKG:  No orders found for this or any previous visit.  ASSESSMENT AND PLAN:   1. Left upper quadrant abdominal mass of unclear etiology, CT abd reveals 14/6/7 cm complex,  Loculated, cystic mass Sx and radiology recommends percutaneous bx/drainage of fluid  patient is  on Zosyn.  S/p  Gastroenterologist eval and had EGD 9/29 EGD with extrinsic gastric body compression, no sample collected Keep pt NPO after mn for percutaneous bx in am  2. Hyponatremia, likely dehydration related. Continue patient on IV fluids and follow sodium level in the morning 3. Leukocytosis. Check cbc am, could be from # 1 4.nicotine dependence- counselled pt to quit smoking for 3-5 min . Provide nicotine patch 5. ETOH dependence - op alcohol rehab recommended     All the records are reviewed and case discussed with Care Management/Social Workerr. Management plans discussed with the patient, family and they are  in agreement.  CODE STATUS: full  TOTAL TIME TAKING CARE OF THIS PATIENT: 35 minutes.   POSSIBLE D/C IN 2 DAYS, DEPENDING ON CLINICAL CONDITION.   Ramonita Lab M.D on 02/27/2015 at 7:48 PM  Between 7am to 6pm - Pager - [phone] After 6pm go to [email or web address] - password [email or web address] [facility]  [place] [facility]  Office  [phone]  CC: Primary care physician; No PCP Per Patient

## 2015-02-27 NOTE — Progress Notes (Signed)
CC: Cystic pancreatic lesion Subjective: Dr. Michela Pitcher performed a consult last night and I've discussed the patient with him and with Dr. Servando Snare who is planning an EGD today. His CT scan is been personally reviewed. Currently patient describes minimal pain no nausea no fevers or chills  Objective: Vital signs in last 24 hours: Temp:  [98.3 F (36.8 C)-99.7 F (37.6 C)] 99.7 F (37.6 C) (09/29 0819) Pulse Rate:  [80-94] 80 (09/29 0819) Resp:  [18-19] 18 (09/29 0819) BP: (124-149)/(36-69) 149/66 mmHg (09/29 0819) SpO2:  [92 %-98 %] 94 % (09/29 0819) Weight:  [172 lb (78.019 kg)] 172 lb (78.019 kg) (09/28 1607) Last BM Date: 02/24/15  Intake/Output from previous day: 09/28 0701 - 09/29 0700 In: 1480 [P.O.:240; I.V.:1240] Out: -  Intake/Output this shift:    Physical exam:  Abdomen is nondistended minimally tender no peritoneal signs nontender calves no icterus  Lab Results: CBC   Recent Labs  02/26/15 0956  WBC 17.2*  HGB 12.5*  HCT 37.9*  PLT 455*   BMET  Recent Labs  02/26/15 0956  NA 131*  K 4.4  CL 91*  CO2 27  GLUCOSE 144*  BUN 9  CREATININE 0.92  CALCIUM 9.5   PT/INR No results for input(s): LABPROT, INR in the last 72 hours. ABG No results for input(s): PHART, HCO3 in the last 72 hours.  Invalid input(s): PCO2, PO2  Studies/Results: Dg Ribs Unilateral W/chest Left  02/26/2015   CLINICAL DATA:  Left anterior rib pain after lifting television on Sunday. Worse with deep breathing and cough.  EXAM: LEFT RIBS AND CHEST - 3+ VIEW  COMPARISON:  None.  FINDINGS: Scarring in the left apex with apical bulla. Lungs otherwise clear. No effusions or pneumothorax. Heart is normal size.  No acute bony abnormality.  No visible rib fracture.  IMPRESSION: Left apical bulla and scarring.  No acute findings.   Electronically Signed   By: Charlett Nose M.D.   On: 02/26/2015 10:30   Ct Chest W Contrast  02/26/2015   CLINICAL DATA:  Epigastric pain, left lower quadrant pain,  left upper quadrant pain, elevated WBC  EXAM: CT CHEST, ABDOMEN, AND PELVIS WITH CONTRAST  TECHNIQUE: Multidetector CT imaging of the chest, abdomen and pelvis was performed following the standard protocol during bolus administration of intravenous contrast.  CONTRAST:  OMNIPAQUE IOHEXOL 300 MG/ML  SOLN  COMPARISON:  None.  FINDINGS: CT CHEST FINDINGS  Mediastinum/Nodes: No mediastinal hematoma. No lymphadenopathy. Normal caliber thoracic aorta. Normal heart size.  Lungs/Pleura: Bilateral centrilobular emphysema. No focal consolidation, pleural effusion or pneumothorax.  Musculoskeletal: No acute osseous abnormality. No aggressive lytic or sclerotic osseous lesion. Maintained vertebral body heights and normal anatomic alignment.  CT ABDOMEN PELVIS FINDINGS  Hepatobiliary: Decreased hepatic echogenicity as can be seen with hepatic steatosis. More focal area of low attenuation in the falciform ligament measuring 2 cm likely reflecting a more focal area of steatosis. No intrahepatic or extrahepatic biliary ductal dilatation.  Pancreas: Pancreas enhances normally and homogeneously.  Spleen: Normal.  Adrenals/Urinary Tract: Normal adrenal glands. Normal kidneys. No urolithiasis or obstructive uropathy. Normal bladder.  Stomach/Bowel: No bowel wall thickening or dilatation. No abdominal or pelvic free fluid. Normal appendix. No pneumatosis, pneumoperitoneum or portal venous gas.  Vascular/Lymphatic: Normal caliber abdominal aorta with atherosclerosis. Numerous small non pathologically enlarged retroperitoneal lymph nodes with the largest measuring 8 mm in short axis along the left para-aortic region.  Prostate: Enlarged prostate gland measuring 5.7 x 3.6 cm.  Other: There is  a complex multiloculated cystic mass in the left upper quadrant measuring 14.6 x 6.3 x 7.1 cm with peripheral enhancement. There is a component which partially encases the gastric antrum.  Musculoskeletal: No acute osseous abnormality. Mild  osteoarthritis of the left hip. No aggressive lytic or sclerotic osseous lesion. Degenerative disc disease with disc height loss and bilateral facet arthropathy at L5-S1.  IMPRESSION: 1. Complex multiloculated cystic mass in the left upper quadrant measuring 14.6 x 6.3 x 7.1 cm with mild peripheral enhancement. There is a component which partially encases the gastric antrum. The appearance is nonspecific and may reflect a pseudocyst versus multiloculated abscess versus cystic malignancy versus sequela of perforated ulcer. Percutaneous fluid sampling would L delineate between these etiologies. 2. Hepatic steatosis.   Electronically Signed   By: Elige Ko   On: 02/26/2015 14:34   Ct Abdomen Pelvis W Contrast  02/26/2015   CLINICAL DATA:  Epigastric pain, left lower quadrant pain, left upper quadrant pain, elevated WBC  EXAM: CT CHEST, ABDOMEN, AND PELVIS WITH CONTRAST  TECHNIQUE: Multidetector CT imaging of the chest, abdomen and pelvis was performed following the standard protocol during bolus administration of intravenous contrast.  CONTRAST:  OMNIPAQUE IOHEXOL 300 MG/ML  SOLN  COMPARISON:  None.  FINDINGS: CT CHEST FINDINGS  Mediastinum/Nodes: No mediastinal hematoma. No lymphadenopathy. Normal caliber thoracic aorta. Normal heart size.  Lungs/Pleura: Bilateral centrilobular emphysema. No focal consolidation, pleural effusion or pneumothorax.  Musculoskeletal: No acute osseous abnormality. No aggressive lytic or sclerotic osseous lesion. Maintained vertebral body heights and normal anatomic alignment.  CT ABDOMEN PELVIS FINDINGS  Hepatobiliary: Decreased hepatic echogenicity as can be seen with hepatic steatosis. More focal area of low attenuation in the falciform ligament measuring 2 cm likely reflecting a more focal area of steatosis. No intrahepatic or extrahepatic biliary ductal dilatation.  Pancreas: Pancreas enhances normally and homogeneously.  Spleen: Normal.  Adrenals/Urinary Tract: Normal  adrenal glands. Normal kidneys. No urolithiasis or obstructive uropathy. Normal bladder.  Stomach/Bowel: No bowel wall thickening or dilatation. No abdominal or pelvic free fluid. Normal appendix. No pneumatosis, pneumoperitoneum or portal venous gas.  Vascular/Lymphatic: Normal caliber abdominal aorta with atherosclerosis. Numerous small non pathologically enlarged retroperitoneal lymph nodes with the largest measuring 8 mm in short axis along the left para-aortic region.  Prostate: Enlarged prostate gland measuring 5.7 x 3.6 cm.  Other: There is a complex multiloculated cystic mass in the left upper quadrant measuring 14.6 x 6.3 x 7.1 cm with peripheral enhancement. There is a component which partially encases the gastric antrum.  Musculoskeletal: No acute osseous abnormality. Mild osteoarthritis of the left hip. No aggressive lytic or sclerotic osseous lesion. Degenerative disc disease with disc height loss and bilateral facet arthropathy at L5-S1.  IMPRESSION: 1. Complex multiloculated cystic mass in the left upper quadrant measuring 14.6 x 6.3 x 7.1 cm with mild peripheral enhancement. There is a component which partially encases the gastric antrum. The appearance is nonspecific and may reflect a pseudocyst versus multiloculated abscess versus cystic malignancy versus sequela of perforated ulcer. Percutaneous fluid sampling would L delineate between these etiologies. 2. Hepatic steatosis.   Electronically Signed   By: Elige Ko   On: 02/26/2015 14:34    Anti-infectives: Anti-infectives    Start     Dose/Rate Route Frequency Ordered Stop   02/26/15 1700  piperacillin-tazobactam (ZOSYN) IVPB 3.375 g     3.375 g 12.5 mL/hr over 240 Minutes Intravenous 3 times per day 02/26/15 1651     02/26/15 1645  piperacillin-tazobactam (ZOSYN) IVPB 3.375 g  Status:  Discontinued     3.375 g 100 mL/hr over 30 Minutes Intravenous 3 times per day 02/26/15 1644 02/26/15 1649      Assessment/Plan:  CT scan is  been personally reviewed and I discussed this with Dr. 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 Because of the location of this and the fact that it is not only retroperitoneal and involves the antrum and body of the stomach but also the body of the pancreas and pancreas and wraps behind the fourth portion of the duodenum near the ligament of Treitz I would suspect vascular involvement. That in mind I would suspect that this would be unresectable if it is a cancer and at the very least would require aggressive surgical therapy I would not recommend needle biopsy at this point as the patient really deserves evaluation at a tertiary care center and surgical planning involving percutaneous biopsy should be left to the operating surgeon. I discussed that with Dr. wall and I will await the results of his EGD findings and and discussed this with prime doc.  Lattie Haw, MD, FACS  02/27/2015

## 2015-02-27 NOTE — Progress Notes (Addendum)
Initial Nutrition Assessment  DOCUMENTATION CODES:   Severe malnutrition in context of acute illness/injury  INTERVENTION:   Meals and Snacks: Cater to patient preferences once able to advance diet order Medical Food Supplement Therapy: will recommend Ensure Enlive po BID, each supplement provides 350 kcal and 20 grams of protein, once able to advance diet    NUTRITION DIAGNOSIS:   Malnutrition related to acute illness as evidenced by energy intake < or equal to 50% for > or equal to 5 days, percent weight loss.  GOAL:   Patient will meet greater than or equal to 90% of their needs  MONITOR:    (Energy Intake, anthropometrics, Digestive system)  REASON FOR ASSESSMENT:   Malnutrition Screening Tool    ASSESSMENT:   Pt admitted with abdominal pain secondary to gastric mass present on CT per MD note. Pt scheduled for EGD today 9/29. Surgery following.  History reviewed. No pertinent past medical history.   Diet Order:  Diet NPO time specified    Current Nutrition: Pt NPO  Food/Nutrition-Related History: Pt reports poor appetite for the past month. Pt reports only eating 2 meals per day and eating 50% of those 2 meals per pt report. Pt reports having early satiety but not wanting to eat regardless. Pt reports no supplement drinks PTA.   Medications: NS at 151mL/hr, Protonix  Electrolyte/Renal Profile and Glucose Profile:   Recent Labs Lab 02/26/15 0956  NA 131*  K 4.4  CL 91*  CO2 27  BUN 9  CREATININE 0.92  CALCIUM 9.5  GLUCOSE 144*   Protein Profile:  Recent Labs Lab 02/26/15 0956  ALBUMIN 3.1*    Gastrointestinal Profile: Last BM:  02/24/2015   Nutrition-Focused Physical Exam Findings: Nutrition-Focused physical exam completed. Findings are WDL for fat depletion, muscle depletion, and edema.    Weight Change: Pt reports weight of 190lbs one month ago. (9% weight loss in one month)   Skin:  Reviewed, no issues   Height:   Ht Readings from  Last 1 Encounters:  02/26/15  (1.753 m)    Weight:   Wt Readings from Last 1 Encounters:  02/26/15 172 lb (78.019 kg)    BMI:  Body mass index is 25.39 kg/(m^2).  Estimated Nutritional Needs:   Kcal:  BEE: 1585kcals, TEE: (IF 1.1-1.3)(AF 1.2) [id number]  Protein:  78-94g protein (1.0-1.2g/kg)  Fluid:  1950-2340mL of fluid (25-53mL/kg)   HIGH Care Level  Leda Quail, RD, LDN Pager [phone]

## 2015-02-27 NOTE — Op Note (Signed)
[facility] Gastroenterology Patient Name: William Marquez Procedure Date: 02/27/2015 6:07 PM MRN: [id number] Account #: [id number] Date of Birth: Jun 09, 1955 Admit Type: Inpatient Age: 59 Room: [facility] ENDO ROOM 4 Gender: Male Note Status: Finalized Procedure:         Upper GI endoscopy Indications:       Abnormal CT of the GI tract Providers:         Midge Minium, MD Referring MD:      No Local Md, MD (Referring MD) Medicines:         Fentanyl 100 micrograms IV, Midazolam 5 mg IV Complications:     No immediate complications. Procedure:         Pre-Anesthesia Assessment:                    - Prior to the procedure, a History and Physical was                     performed, and patient medications and allergies were                     reviewed. The patient's tolerance of previous anesthesia                     was also reviewed. The risks and benefits of the procedure                     and the sedation options and risks were discussed with the                     patient. All questions were answered, and informed consent                     was obtained. Prior Anticoagulants: The patient has taken                     no previous anticoagulant or antiplatelet agents. ASA                     Grade Assessment: II - A patient with mild systemic                     disease. After reviewing the risks and benefits, the                     patient was deemed in satisfactory condition to undergo                     the procedure.                    After obtaining informed consent, the endoscope was passed                     under direct vision. Throughout the procedure, the                     patient's blood pressure, pulse, and oxygen saturations                     were monitored continuously. The Endoscope was introduced                     through the mouth, and advanced to the second part of  duodenum. The upper GI endoscopy was accomplished without                      difficulty. The patient tolerated the procedure well. Findings:      The examined esophagus was normal.      Extrinsic compression on the stomach was found in the gastric body.      The examined duodenum was normal. Impression:        - Normal esophagus.                    - Extrinsic compression in the gastric body.                    - Normal examined duodenum.                    - No specimens collected. Recommendation:    - Return patient to hospital ward for ongoing care. Procedure Code(s): --- Professional ---                    [phone], Esophagogastroduodenoscopy, flexible, transoral;                     diagnostic, including collection of specimen(s) by                     brushing or washing, when performed (separate procedure) Diagnosis Code(s): --- Professional ---                    R93.3, Abnormal findings on diagnostic imaging of other                     parts of digestive tract                    K31.89, Other diseases of stomach and duodenum CPT copyright 2014 American Medical Association. All rights reserved. The codes documented in this report are preliminary and upon coder review may  be revised to meet current compliance requirements. Midge Minium, MD 02/27/2015 6:21:52 PM This report has been signed electronically. Number of Addenda: 0 Note Initiated On: 02/27/2015 6:07 PM      [facility]

## 2015-02-28 ENCOUNTER — Encounter: Payer: Self-pay | Admitting: Gastroenterology

## 2015-02-28 DIAGNOSIS — R591 Generalized enlarged lymph nodes: Secondary | ICD-10-CM

## 2015-02-28 DIAGNOSIS — N400 Enlarged prostate without lower urinary tract symptoms: Secondary | ICD-10-CM

## 2015-02-28 DIAGNOSIS — R1902 Left upper quadrant abdominal swelling, mass and lump: Secondary | ICD-10-CM

## 2015-02-28 DIAGNOSIS — R5383 Other fatigue: Secondary | ICD-10-CM

## 2015-02-28 DIAGNOSIS — R112 Nausea with vomiting, unspecified: Secondary | ICD-10-CM

## 2015-02-28 DIAGNOSIS — I499 Cardiac arrhythmia, unspecified: Secondary | ICD-10-CM

## 2015-02-28 DIAGNOSIS — R002 Palpitations: Secondary | ICD-10-CM

## 2015-02-28 DIAGNOSIS — Z8669 Personal history of other diseases of the nervous system and sense organs: Secondary | ICD-10-CM

## 2015-02-28 DIAGNOSIS — D72829 Elevated white blood cell count, unspecified: Secondary | ICD-10-CM

## 2015-02-28 DIAGNOSIS — E8889 Other specified metabolic disorders: Secondary | ICD-10-CM

## 2015-02-28 DIAGNOSIS — M1990 Unspecified osteoarthritis, unspecified site: Secondary | ICD-10-CM

## 2015-02-28 DIAGNOSIS — F17210 Nicotine dependence, cigarettes, uncomplicated: Secondary | ICD-10-CM

## 2015-02-28 DIAGNOSIS — R05 Cough: Secondary | ICD-10-CM

## 2015-02-28 DIAGNOSIS — K5900 Constipation, unspecified: Secondary | ICD-10-CM

## 2015-02-28 DIAGNOSIS — R630 Anorexia: Secondary | ICD-10-CM

## 2015-02-28 LAB — CBC
HEMATOCRIT: 34.1 % — AB (ref 40.0–52.0)
Hemoglobin: 11.5 g/dL — ABNORMAL LOW (ref 13.0–18.0)
MCH: 33.6 pg (ref 26.0–34.0)
MCHC: 33.7 g/dL (ref 32.0–36.0)
MCV: 99.9 fL (ref 80.0–100.0)
Platelets: 419 10*3/uL (ref 150–440)
RBC: 3.41 MIL/uL — ABNORMAL LOW (ref 4.40–5.90)
RDW: 13.3 % (ref 11.5–14.5)
WBC: 14.2 10*3/uL — AB (ref 3.8–10.6)

## 2015-02-28 LAB — BASIC METABOLIC PANEL
ANION GAP: 10 (ref 5–15)
BUN: 7 mg/dL (ref 6–20)
CALCIUM: 8.6 mg/dL — AB (ref 8.9–10.3)
CO2: 25 mmol/L (ref 22–32)
Chloride: 99 mmol/L — ABNORMAL LOW (ref 101–111)
Creatinine, Ser: 0.86 mg/dL (ref 0.61–1.24)
GFR calc Af Amer: >60 mL/min (ref >60)
GFR calc non Af Amer: >60 mL/min (ref >60)
GLUCOSE: 119 mg/dL — AB (ref 65–99)
Potassium: 4.2 mmol/L (ref 3.5–5.1)
Sodium: 134 mmol/L — ABNORMAL LOW (ref 135–145)

## 2015-02-28 LAB — PROTIME-INR
INR: 1.15
Prothrombin Time: 14.9 seconds (ref 11.4–15.0)

## 2015-02-28 MED ORDER — ACETAMINOPHEN 325 MG PO TABS
650.0000 mg | ORAL_TABLET | Freq: Four times a day (QID) | ORAL | Status: DC | PRN
  Administered 2015-02-28 – 2015-03-02 (×2): 650 mg via ORAL
  Filled 2015-02-28 (×3): qty 2

## 2015-02-28 NOTE — Progress Notes (Signed)
CC: abd pain Subjective: Patient's abdominal pain is improved today he's had no nausea or vomiting.  EGD results are noted that the mass is extrinsic to the stomach and not mucosal-based.  Objective: Vital signs in last 24 hours: Temp:  [97.6 F (36.4 C)-101.1 F (38.4 C)] 100.5 F (38.1 C) (09/30 0450) Pulse Rate:  [80-95] 92 (09/30 0450) Resp:  [17-20] 20 (09/30 0450) BP: (132-164)/(57-95) 144/70 mmHg (09/30 0450) SpO2:  [93 %-100 %] 93 % (09/30 0450) Last BM Date: 02/27/15  Intake/Output from previous day:   Intake/Output this shift:    Physical exam:  No apparent distress Abdomen is slightly distended slightly tympanitic but nontender Nontender calves  Lab Results: CBC   Recent Labs  02/26/15 0956 02/28/15 0513  WBC 17.2* 14.2*  HGB 12.5* 11.5*  HCT 37.9* 34.1*  PLT 455* 419   BMET  Recent Labs  02/26/15 0956 02/28/15 0513  NA 131* 134*  K 4.4 4.2  CL 91* 99*  CO2 27 25  GLUCOSE 144* 119*  BUN 9 7  CREATININE 0.92 0.86  CALCIUM 9.5 8.6*   PT/INR  Recent Labs  02/28/15 0513  LABPROT 14.9  INR 1.15   ABG No results for input(s): PHART, HCO3 in the last 72 hours.  Invalid input(s): PCO2, PO2  Studies/Results: Dg Ribs Unilateral W/chest Left  02/26/2015   CLINICAL DATA:  Left anterior rib pain after lifting television on Sunday. Worse with deep breathing and cough.  EXAM: LEFT RIBS AND CHEST - 3+ VIEW  COMPARISON:  None.  FINDINGS: Scarring in the left apex with apical bulla. Lungs otherwise clear. No effusions or pneumothorax. Heart is normal size.  No acute bony abnormality.  No visible rib fracture.  IMPRESSION: Left apical bulla and scarring.  No acute findings.   Electronically Signed   By: Charlett Nose M.D.   On: 02/26/2015 10:30   Ct Chest W Contrast  02/26/2015   CLINICAL DATA:  Epigastric pain, left lower quadrant pain, left upper quadrant pain, elevated WBC  EXAM: CT CHEST, ABDOMEN, AND PELVIS WITH CONTRAST  TECHNIQUE: Multidetector  CT imaging of the chest, abdomen and pelvis was performed following the standard protocol during bolus administration of intravenous contrast.  CONTRAST:  OMNIPAQUE IOHEXOL 300 MG/ML  SOLN  COMPARISON:  None.  FINDINGS: CT CHEST FINDINGS  Mediastinum/Nodes: No mediastinal hematoma. No lymphadenopathy. Normal caliber thoracic aorta. Normal heart size.  Lungs/Pleura: Bilateral centrilobular emphysema. No focal consolidation, pleural effusion or pneumothorax.  Musculoskeletal: No acute osseous abnormality. No aggressive lytic or sclerotic osseous lesion. Maintained vertebral body heights and normal anatomic alignment.  CT ABDOMEN PELVIS FINDINGS  Hepatobiliary: Decreased hepatic echogenicity as can be seen with hepatic steatosis. More focal area of low attenuation in the falciform ligament measuring 2 cm likely reflecting a more focal area of steatosis. No intrahepatic or extrahepatic biliary ductal dilatation.  Pancreas: Pancreas enhances normally and homogeneously.  Spleen: Normal.  Adrenals/Urinary Tract: Normal adrenal glands. Normal kidneys. No urolithiasis or obstructive uropathy. Normal bladder.  Stomach/Bowel: No bowel wall thickening or dilatation. No abdominal or pelvic free fluid. Normal appendix. No pneumatosis, pneumoperitoneum or portal venous gas.  Vascular/Lymphatic: Normal caliber abdominal aorta with atherosclerosis. Numerous small non pathologically enlarged retroperitoneal lymph nodes with the largest measuring 8 mm in short axis along the left para-aortic region.  Prostate: Enlarged prostate gland measuring 5.7 x 3.6 cm.  Other: There is a complex multiloculated cystic mass in the left upper quadrant measuring 14.6 x 6.3 x 7.1 cm  with peripheral enhancement. There is a component which partially encases the gastric antrum.  Musculoskeletal: No acute osseous abnormality. Mild osteoarthritis of the left hip. No aggressive lytic or sclerotic osseous lesion. Degenerative disc disease with disc  height loss and bilateral facet arthropathy at L5-S1.  IMPRESSION: 1. Complex multiloculated cystic mass in the left upper quadrant measuring 14.6 x 6.3 x 7.1 cm with mild peripheral enhancement. There is a component which partially encases the gastric antrum. The appearance is nonspecific and may reflect a pseudocyst versus multiloculated abscess versus cystic malignancy versus sequela of perforated ulcer. Percutaneous fluid sampling would L delineate between these etiologies. 2. Hepatic steatosis.   Electronically Signed   By: Elige Ko   On: 02/26/2015 14:34   Ct Abdomen Pelvis W Contrast  02/26/2015   CLINICAL DATA:  Epigastric pain, left lower quadrant pain, left upper quadrant pain, elevated WBC  EXAM: CT CHEST, ABDOMEN, AND PELVIS WITH CONTRAST  TECHNIQUE: Multidetector CT imaging of the chest, abdomen and pelvis was performed following the standard protocol during bolus administration of intravenous contrast.  CONTRAST:  OMNIPAQUE IOHEXOL 300 MG/ML  SOLN  COMPARISON:  None.  FINDINGS: CT CHEST FINDINGS  Mediastinum/Nodes: No mediastinal hematoma. No lymphadenopathy. Normal caliber thoracic aorta. Normal heart size.  Lungs/Pleura: Bilateral centrilobular emphysema. No focal consolidation, pleural effusion or pneumothorax.  Musculoskeletal: No acute osseous abnormality. No aggressive lytic or sclerotic osseous lesion. Maintained vertebral body heights and normal anatomic alignment.  CT ABDOMEN PELVIS FINDINGS  Hepatobiliary: Decreased hepatic echogenicity as can be seen with hepatic steatosis. More focal area of low attenuation in the falciform ligament measuring 2 cm likely reflecting a more focal area of steatosis. No intrahepatic or extrahepatic biliary ductal dilatation.  Pancreas: Pancreas enhances normally and homogeneously.  Spleen: Normal.  Adrenals/Urinary Tract: Normal adrenal glands. Normal kidneys. No urolithiasis or obstructive uropathy. Normal bladder.  Stomach/Bowel: No bowel wall  thickening or dilatation. No abdominal or pelvic free fluid. Normal appendix. No pneumatosis, pneumoperitoneum or portal venous gas.  Vascular/Lymphatic: Normal caliber abdominal aorta with atherosclerosis. Numerous small non pathologically enlarged retroperitoneal lymph nodes with the largest measuring 8 mm in short axis along the left para-aortic region.  Prostate: Enlarged prostate gland measuring 5.7 x 3.6 cm.  Other: There is a complex multiloculated cystic mass in the left upper quadrant measuring 14.6 x 6.3 x 7.1 cm with peripheral enhancement. There is a component which partially encases the gastric antrum.  Musculoskeletal: No acute osseous abnormality. Mild osteoarthritis of the left hip. No aggressive lytic or sclerotic osseous lesion. Degenerative disc disease with disc height loss and bilateral facet arthropathy at L5-S1.  IMPRESSION: 1. Complex multiloculated cystic mass in the left upper quadrant measuring 14.6 x 6.3 x 7.1 cm with mild peripheral enhancement. There is a component which partially encases the gastric antrum. The appearance is nonspecific and may reflect a pseudocyst versus multiloculated abscess versus cystic malignancy versus sequela of perforated ulcer. Percutaneous fluid sampling would L delineate between these etiologies. 2. Hepatic steatosis.   Electronically Signed   By: Elige Ko   On: 02/26/2015 14:34    Anti-infectives: Anti-infectives    Start     Dose/Rate Route Frequency Ordered Stop   02/26/15 1700  piperacillin-tazobactam (ZOSYN) IVPB 3.375 g     3.375 g 12.5 mL/hr over 240 Minutes Intravenous 3 times per day 02/26/15 1651     02/26/15 1645  piperacillin-tazobactam (ZOSYN) IVPB 3.375 g  Status:  Discontinued     3.375 g 100 mL/hr  over 30 Minutes Intravenous 3 times per day 02/26/15 1644 02/26/15 1649      Assessment/Plan:  EGD results are reviewed. 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 I would therefore recommend transfer to a tertiary center that is capable of performing such a and complicated procedure, either [facility] or [address] MD, FACS  02/28/2015

## 2015-02-28 NOTE — Plan of Care (Signed)
Problem: Discharge Progression Outcomes Goal: Other Discharge Outcomes/Goals Outcome: Progressing Alert and oriented. PRN morphine x3 for abdominal pain. NPO since midnight for percutaneous bx today.

## 2015-02-28 NOTE — Consult Note (Signed)
ONCOLOGY CONSULTATION NOTE -  Reason for Consultation: Large upper abdominal mass, multicystic, encasing part of stomach.  HISTORY OF PRESENT ILLNESS:  William Marquez is a 59 -year-old gentleman with past medical history significant for migraine headaches and known history of irregular heartbeat since childhood. Patient presented to Hospital with complaints of persistent or progressive upper abdominal pain, more so on the left side. States that this has prevented him from eating adequately and he has had some unintentional weight loss which he states is approximately 20-25 pounds. He was noted to have elevated white blood cell count and CT scan of abdomen and pelvis as well as chest was performed which revealed left upper quadrant abdominal mass, described as Complex multiloculated cystic mass in the left upper quadrant measuring 14.6 x 6.3 x 7.1 cm with mild peripheral enhancement, there is a component which partially encases the gastric antrum, the appearance is nonspecific and may reflect a pseudocyst versus multiloculated abscess versus cystic malignancy versus sequela of perforated ulcer. Patient does have history of alcohol intake but denies history of recurrent pancreatitis in the past. He denies any history of known malignancy in the past. No new bone pains. No fevers or night sweats. He had EGD done yesterday which reported esophagus and stomach as unremarkable, there was evidence of extrinsic compression on the stomach at gastric body.   PAST MEDICAL HISTORY: Migraine headaches, irregular heartbeats since childhood  PAST SURGICAL HISTORY: None .  SOCIAL HISTORY:  Social History  Substance Use Topics  . Smoking status: Current Every Day Smoker  . Smokeless tobacco: Not on file  . Alcohol Use: Yes    FAMILY HISTORY: Denies malignancy.  DRUG ALLERGIES: No Known Allergies  Review of Systems  Constitutional: Generalized fatigue on exertion. Appetite has been fluctuating due  to abdominal pain, has had some unintentional weight loss. States that he has a fairly good appetite otherwise. No fevers or night sweats.   HEENT: Currently denies headaches, dizziness. No epistaxis, ear or jaw pain. No sore throat.    Respiratory: Positive for cough and sputum production. Negative for shortness of breath and wheezing.  Cardiovascular: Positive for palpitations. Negative for chest pain, orthopnea and PND.  Gastrointestinal: Positive for nausea, vomiting, abdominal pain and constipation. No diarrhea, blood in stool and melena.  Genitourinary: Negative for dysuria or hematuria.  Musculoskeletal: Denies new bone pains.  Skin: Negative for rash.  Neurological: Denies new focal weakness, seizures or loss of consciousness.   Endocrine: As in history of present illness. No polyuria or polydipsia.    MEDICATIONS AT HOME:  Prior to Admission medications   Medication Sig      ibuprofen (ADVIL,MOTRIN) 200 MG tablet Take 200 mg by mouth every 6 (six) hours as needed.      oxyCODONE-acetaminophen (ROXICET) 5-325 MG tablet Take 1 tablet by mouth every 8 (eight) hours as needed for moderate pain or severe pain (Do not drive or operate heavy machinery while taking as can cause drowsiness.).         PHYSICAL EXAMINATION:   VITAL SIGNS: Tmax 101.1, Tc 99.4, 61, 20, 144/62, 100% GENERAL: Patient is moderately built and nourished individual, resting in bed, alert and oriented, in no acute distress. No icterus. HEENT: Extraocular movements intact. No oral thrush. NECK: Supple, no cervical lymphadenopathy  LUNGS: Bilateral good air entry, no crepitations or rhonchi.  CARDIOVASCULAR: S1, S2 regular.  ABDOMEN: Soft, acutely tender mostly on the left side , guarding in the suprapubic area extending to left upper quadrant ,  no rebound. Bowel sounds present.  EXTREMITIES: No pedal edema, cyanosis.  NEUROLOGIC: Cranial nerves are intact, grossly nonfocal.  SKIN: No obvious  rash or major bruising.    LABORATORY PANEL:  CBC  Last Labs      Recent Labs Lab 02/26/15 0956  WBC 17.2*  HGB 12.5*  HCT 37.9*  PLT 455*  MCV 99.2  MCH 32.7  MCHC 33.0  RDW 13.1  LYMPHSABS 0.7*  MONOABS 1.5*  EOSABS 0.0  BASOSABS 0.0     ------------------------------------------------------------------------------------------------------------------  Chemistries   Last Labs      Recent Labs Lab 02/26/15 0956  NA 131*  K 4.4  CL 91*  CO2 27  GLUCOSE 144*  BUN 9  CREATININE 0.92  CALCIUM 9.5  AST 24  ALT 16*  ALKPHOS 90  BILITOT 1.8*    ------------------------------------------------------------------------------------------------------------------  RADIOLOGY:       Ct Chest W Contrast 02/26/2015 CLINICAL DATA: Epigastric pain, left lower quadrant pain, left upper quadrant pain, elevated WBC EXAM: CT CHEST, ABDOMEN, AND PELVIS WITH CONTRAST TECHNIQUE: Multidetector CT imaging of the chest, abdomen and pelvis was performed following the standard protocol during bolus administration of intravenous contrast. CONTRAST: OMNIPAQUE IOHEXOL 300 MG/ML SOLN COMPARISON: None. FINDINGS: CT CHEST FINDINGS Mediastinum/Nodes: No mediastinal hematoma. No lymphadenopathy. Normal caliber thoracic aorta. Normal heart size. Lungs/Pleura: Bilateral centrilobular emphysema. No focal consolidation, pleural effusion or pneumothorax. Musculoskeletal: No acute osseous abnormality. No aggressive lytic or sclerotic osseous lesion. Maintained vertebral body heights and normal anatomic alignment. CT ABDOMEN PELVIS FINDINGS Hepatobiliary: Decreased hepatic echogenicity as can be seen with hepatic steatosis. More focal area of low attenuation in the falciform ligament measuring 2 cm likely reflecting a more focal area of steatosis. No intrahepatic or extrahepatic biliary ductal dilatation. Pancreas: Pancreas enhances  normally and homogeneously. Spleen: Normal. Adrenals/Urinary Tract: Normal adrenal glands. Normal kidneys. No urolithiasis or obstructive uropathy. Normal bladder. Stomach/Bowel: No bowel wall thickening or dilatation. No abdominal or pelvic free fluid. Normal appendix. No pneumatosis, pneumoperitoneum or portal venous gas. Vascular/Lymphatic: Normal caliber abdominal aorta with atherosclerosis. Numerous small non pathologically enlarged retroperitoneal lymph nodes with the largest measuring 8 mm in short axis along the left para-aortic region. Prostate: Enlarged prostate gland measuring 5.7 x 3.6 cm. Other: There is a complex multiloculated cystic mass in the left upper quadrant measuring 14.6 x 6.3 x 7.1 cm with peripheral enhancement. There is a component which partially encases the gastric antrum. Musculoskeletal: No acute osseous abnormality. Mild osteoarthritis of the left hip. No aggressive lytic or sclerotic osseous lesion. Degenerative disc disease with disc height loss and bilateral facet arthropathy at L5-S1. IMPRESSION: 1. Complex multiloculated cystic mass in the left upper quadrant measuring 14.6 x 6.3 x 7.1 cm with mild peripheral enhancement. There is a component which partially encases the gastric antrum. The appearance is nonspecific and may reflect a pseudocyst versus multiloculated abscess versus cystic malignancy versus sequela of perforated ulcer. Percutaneous fluid sampling would L delineate between these etiologies. 2. Hepatic steatosis. Electronically Signed By: Elige Ko On: 02/26/2015 14:34   Ct Abdomen Pelvis W Contrast 02/26/2015 CLINICAL DATA: Epigastric pain, left lower quadrant pain, left upper quadrant pain, elevated WBC EXAM: CT CHEST, ABDOMEN, AND PELVIS WITH CONTRAST TECHNIQUE: Multidetector CT imaging of the chest, abdomen and pelvis was performed following the standard protocol during bolus administration of intravenous contrast. CONTRAST:  OMNIPAQUE IOHEXOL 300 MG/ML SOLN COMPARISON: None. FINDINGS: CT CHEST FINDINGS Mediastinum/Nodes: No mediastinal hematoma. No lymphadenopathy. Normal caliber thoracic aorta. Normal heart size.  Lungs/Pleura: Bilateral centrilobular emphysema. No focal consolidation, pleural effusion or pneumothorax. Musculoskeletal: No acute osseous abnormality. No aggressive lytic or sclerotic osseous lesion. Maintained vertebral body heights and normal anatomic alignment. CT ABDOMEN PELVIS FINDINGS Hepatobiliary: Decreased hepatic echogenicity as can be seen with hepatic steatosis. More focal area of low attenuation in the falciform ligament measuring 2 cm likely reflecting a more focal area of steatosis. No intrahepatic or extrahepatic biliary ductal dilatation. Pancreas: Pancreas enhances normally and homogeneously. Spleen: Normal. Adrenals/Urinary Tract: Normal adrenal glands. Normal kidneys. No urolithiasis or obstructive uropathy. Normal bladder. Stomach/Bowel: No bowel wall thickening or dilatation. No abdominal or pelvic free fluid. Normal appendix. No pneumatosis, pneumoperitoneum or portal venous gas. Vascular/Lymphatic: Normal caliber abdominal aorta with atherosclerosis. Numerous small non pathologically enlarged retroperitoneal lymph nodes with the largest measuring 8 mm in short axis along the left para-aortic region. Prostate: Enlarged prostate gland measuring 5.7 x 3.6 cm. Other: There is a complex multiloculated cystic mass in the left upper quadrant measuring 14.6 x 6.3 x 7.1 cm with peripheral enhancement. There is a component which partially encases the gastric antrum. Musculoskeletal: No acute osseous abnormality. Mild osteoarthritis of the left hip. No aggressive lytic or sclerotic osseous lesion. Degenerative disc disease with disc height loss and bilateral facet arthropathy at L5-S1. IMPRESSION: 1. Complex multiloculated cystic mass in the left upper quadrant measuring 14.6 x 6.3 x 7.1 cm  with mild peripheral enhancement. There is a component which partially encases the gastric antrum. The appearance is nonspecific and may reflect a pseudocyst versus multiloculated abscess versus cystic malignancy versus sequela of perforated ulcer. Percutaneous fluid sampling would L delineate between these etiologies. 2. Hepatic steatosis. Electronically Signed By: Elige Ko On: 02/26/2015 14:34     IMPRESSION / RECOMMENDATIONS: 59 year old gentleman with the above medical history, admitted with severe upper abdominal pain, also having unintentional weight loss, and CT scan evaluation reporting a Complex multiloculated cystic mass in the left upper quadrant measuring 14.6 x 6.3 x 7.1 cm with mild peripheral enhancement, there is a component which partially encases the gastric antrum, the appearance is nonspecific and may reflect a pseudocyst versus multiloculated abscess versus cystic malignancy versus sequela of perforated ulcer. Patient does have history of alcohol intake but denies history of recurrent pancreatitis in the past. He denies any history of known malignancy in the past. EGD done yesterday reported esophagus and stomach as unremarkable, there was evidence of extrinsic compression on the stomach at gastric body. Given the above, it does raise a possibility of malignancy. Will request serum CA 19-9 level. Patient is being planned for percutaneous biopsy by radiology early next week. Otherwise he also has evidence of ongoing infection and is on IV anti-biotic with Zosyn. Patient has also been evaluated by surgeon Dr. Excell Seltzer who feels that if he needs surgical resection of the mass then it will need to be done at a tertiary center. Will follow up after CA 19-9 level and biopsy result is available and make further recommendations. Continue supportive treatment, pain control. Patient is agreeable to this plan. Thank you for the referral, please feel free to contact me if any additional  questions.

## 2015-02-28 NOTE — Care Management (Addendum)
Admitted to [facility] with the diagnosis of abdominal mass. Lives with brother, [name] in [place]..  Girlfriend is [name] [phone]). States he has seen a primary care physician in many years. "Been incarcerated.". No Home Health. Takes care of all basic activities of daily living himself. States he does have Medicaid as his insurance. Will give information on physician's accepting new patients. EGD yesterday. Dr Excell Seltzer in room. Would like [name] to go to [facility] or [facility]. "The problem is outside of the stomach. " per Dr. Excell Seltzer. Will discuss with rounding physician Gwenette Greet RN MSN Care Management [phone]

## 2015-02-28 NOTE — Progress Notes (Signed)
[facility] Physicians - [place] at [facility]   PATIENT NAME: William Marquez    MR#:  [id number]  DATE OF BIRTH:  1956-05-30  SUBJECTIVE:  CHIEF COMPLAINT: Pt is resting comfortably, left abd pain is significantly improved. Percutaneous biopsy is rescheduled for Monday . Patient was febrile this morning. Denies any chest pain or shortness of breath. No cough  REVIEW OF SYSTEMS:  CONSTITUTIONAL: No fever, fatigue or weakness.  EYES: No blurred or double vision.  EARS, NOSE, AND THROAT: No tinnitus or ear pain.  RESPIRATORY: No cough, shortness of breath, wheezing or hemoptysis.  CARDIOVASCULAR: No chest pain, orthopnea, edema.  GASTROINTESTINAL: No nausea, vomiting, diarrhea  LUQ mass and  improved abdominal pain.  GENITOURINARY: No dysuria, hematuria.  ENDOCRINE: No polyuria, nocturia,  HEMATOLOGY: No anemia, easy bruising or bleeding SKIN: No rash or lesion. MUSCULOSKELETAL: No joint pain or arthritis.   NEUROLOGIC: No tingling, numbness, weakness.  PSYCHIATRY: No anxiety or depression.   DRUG ALLERGIES:  No Known Allergies  VITALS:  Blood pressure 144/62, pulse 61, temperature 99.4 F (37.4 C), temperature source Oral, resp. rate 20, height  (1.753 m), weight 78.019 kg (172 lb), SpO2 100 %.  PHYSICAL EXAMINATION:  GENERAL:  59 y.o.-year-old patient lying in the bed with no acute distress.  EYES: Pupils equal, round, reactive to light and accommodation. No scleral icterus. Extraocular muscles intact.  HEENT: Head atraumatic, normocephalic. Oropharynx and nasopharynx clear.  NECK:  Supple, no jugular venous distention. No thyroid enlargement, no tenderness.  LUNGS: Normal breath sounds bilaterally, no wheezing, rales,rhonchi or crepitation. No use of accessory muscles of respiration.  CARDIOVASCULAR: S1, S2 normal. No murmurs, rubs, or gallops.  ABDOMEN: Soft, LUQ is tender, nondistended. Bowel sounds present. LUQ  mass.  EXTREMITIES: No pedal edema,  cyanosis, or clubbing.  NEUROLOGIC: Cranial nerves II through XII are intact. Muscle strength 5/5 in all extremities. Sensation intact. Gait not checked.  PSYCHIATRIC: The patient is alert and oriented x 3.  SKIN: No obvious rash, lesion, or ulcer.    LABORATORY PANEL:   CBC  Recent Labs Lab 02/28/15 0513  WBC 14.2*  HGB 11.5*  HCT 34.1*  PLT 419   ------------------------------------------------------------------------------------------------------------------  Chemistries   Recent Labs Lab 02/26/15 0956 02/28/15 0513  NA 131* 134*  K 4.4 4.2  CL 91* 99*  CO2 27 25  GLUCOSE 144* 119*  BUN 9 7  CREATININE 0.92 0.86  CALCIUM 9.5 8.6*  AST 24  --   ALT 16*  --   ALKPHOS 90  --   BILITOT 1.8*  --    ------------------------------------------------------------------------------------------------------------------  Cardiac Enzymes No results for input(s): TROPONINI in the last 168 hours. ------------------------------------------------------------------------------------------------------------------  RADIOLOGY:  No results found.  EKG:  No orders found for this or any previous visit.  ASSESSMENT AND PLAN:   1. Sepsis with fever and leukocytosis unclear etiology Blood cultures 2 and urine culture and sensitivity are ordered Patient is on Zosyn IV Chest x-ray was normal, CBC in a.m.  2.Left upper quadrant abdominal mass of unclear etiology, CT abd reveals 14/6/7 cm complex,  Loculated, cystic mass radiology recommends percutaneous bx/drainage of fluid  patient is  on Zosyn.  S/p  Gastroenterologist eval and had EGD 9/29  with extrinsic gastric body compression, no sample collected Surgery, Dr. 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 Patient being hemodynamically stable can be seen  by tertiary care center as an outpatient once  his sepsis is improved Oncology consult is placed  2. Hyponatremia, likely dehydration related. Sodium level significant improved to 134 with IV fluids.  3. .nicotine dependence- counselled pt to quit smoking for 3-5 min . Provide nicotine patch 4. ETOH dependence - op alcohol rehab recommended     All the records are reviewed and case discussed with Care Management/Social Workerr. Management plans discussed with the patient, family and they are in agreement.  CODE STATUS: full  TOTAL TIME TAKING CARE OF THIS PATIENT: 35 minutes.   POSSIBLE D/C IN 2 DAYS, once patient is afebrile for 24 hours DEPENDING ON CLINICAL CONDITION.   Ramonita Lab M.D on 02/28/2015 at 2:53 PM  Between 7am to 6pm - Pager - [phone] After 6pm go to [email or web address] - password [email or web address] [facility]  [place] [facility]  Office  [phone]  CC: Primary care physician; No PCP Per Patient

## 2015-02-28 NOTE — Plan of Care (Signed)
Problem: Discharge Progression Outcomes Goal: Other Discharge Outcomes/Goals Outcome: Progressing Plan of care progress: -Continues IV fluids -monitor for fever, monitor WBC -oncology, surgery and GI consulting -pt unable to have bx until Monday, pt may discharge and go to outpt [facility] or [facility] for procedures -PRN morphine for abd pain with relief -up independ

## 2015-02-28 NOTE — Progress Notes (Signed)
Dr Amado Coe made aware that pt unable to have pc bx today related to heparin, special procedures states that they can do it Monday at the earliest, pt with fever noted this early this am, states that she will place new orders and that pt may have a regular diet

## 2015-03-01 LAB — OCCULT BLOOD X 1 CARD TO LAB, STOOL
FECAL OCCULT BLD: NEGATIVE
Fecal Occult Bld: NEGATIVE

## 2015-03-01 LAB — CBC
HCT: 35.7 % — ABNORMAL LOW (ref 40.0–52.0)
Hemoglobin: 11.7 g/dL — ABNORMAL LOW (ref 13.0–18.0)
MCH: 32.9 pg (ref 26.0–34.0)
MCHC: 32.9 g/dL (ref 32.0–36.0)
MCV: 99.9 fL (ref 80.0–100.0)
PLATELETS: 423 10*3/uL (ref 150–440)
RBC: 3.57 MIL/uL — AB (ref 4.40–5.90)
RDW: 13.3 % (ref 11.5–14.5)
WBC: 13.8 10*3/uL — AB (ref 3.8–10.6)

## 2015-03-01 MED ORDER — OXYCODONE HCL 5 MG PO TABS
5.0000 mg | ORAL_TABLET | Freq: Four times a day (QID) | ORAL | Status: DC | PRN
  Administered 2015-03-02 – 2015-03-05 (×9): 5 mg via ORAL
  Filled 2015-03-01 (×9): qty 1

## 2015-03-01 MED ORDER — ENSURE ENLIVE PO LIQD
237.0000 mL | Freq: Two times a day (BID) | ORAL | Status: DC
  Administered 2015-03-01 – 2015-03-05 (×9): 237 mL via ORAL

## 2015-03-01 MED ORDER — SODIUM CHLORIDE 0.9 % IJ SOLN
3.0000 mL | Freq: Two times a day (BID) | INTRAMUSCULAR | Status: DC
  Administered 2015-03-01 – 2015-03-05 (×9): 3 mL via INTRAVENOUS

## 2015-03-01 MED ORDER — SODIUM CHLORIDE 0.9 % IJ SOLN
3.0000 mL | INTRAMUSCULAR | Status: DC | PRN
  Administered 2015-03-01: 3 mL via INTRAVENOUS
  Filled 2015-03-01: qty 10

## 2015-03-01 NOTE — Progress Notes (Signed)
[facility] Physicians - [place] at [facility]   PATIENT NAME: William Marquez    MR#:  [id number]  DATE OF BIRTH:  [birth year]  SUBJECTIVE:  No complaints. Appetite ok. Low grade fever  REVIEW OF SYSTEMS:  CONSTITUTIONAL: No fever, fatigue or weakness.  EYES: No blurred or double vision.  EARS, NOSE, AND THROAT: No tinnitus or ear pain.  RESPIRATORY: No cough, shortness of breath, wheezing or hemoptysis.  CARDIOVASCULAR: No chest pain, orthopnea, edema.  GASTROINTESTINAL: No nausea, vomiting, diarrhea  LUQ mass and  improved abdominal pain.  GENITOURINARY: No dysuria, hematuria.  ENDOCRINE: No polyuria, nocturia,  HEMATOLOGY: No anemia, easy bruising or bleeding SKIN: No rash or lesion. MUSCULOSKELETAL: No joint pain or arthritis.   NEUROLOGIC: No tingling, numbness, weakness.  PSYCHIATRY: No anxiety or depression.   DRUG ALLERGIES:  No Known Allergies  VITALS:  Blood pressure 140/70, pulse 77, temperature 100.1 F (37.8 C), temperature source Oral, resp. rate 18, height  (1.753 m), weight 78.019 kg (172 lb), SpO2 97 %.  PHYSICAL EXAMINATION:  GENERAL:  59 y.o.-year-old patient lying in the bed with no acute distress.  EYES: Pupils equal, round, reactive to light and accommodation. No scleral icterus. Extraocular muscles intact.  HEENT: Head atraumatic, normocephalic. Oropharynx and nasopharynx clear.  NECK:  Supple, no jugular venous distention. No thyroid enlargement, no tenderness.  LUNGS: Normal breath sounds bilaterally, no wheezing, rales,rhonchi or crepitation. No use of accessory muscles of respiration.  CARDIOVASCULAR: S1, S2 normal. No murmurs, rubs, or gallops.  ABDOMEN: Soft, LUQ is tender, nondistended. Bowel sounds present. LUQ  mass.  EXTREMITIES: No pedal edema, cyanosis, or clubbing.  NEUROLOGIC: Cranial nerves II through XII are intact. Muscle strength 5/5 in all extremities. Sensation intact. Gait not checked.  PSYCHIATRIC: The patient is  alert and oriented x 3.  SKIN: No obvious rash, lesion, or ulcer.    LABORATORY PANEL:   CBC  Recent Labs Lab 03/01/15 0443  WBC 13.8*  HGB 11.7*  HCT 35.7*  PLT 423   ------------------------------------------------------------------------------------------------------------------  Chemistries   Recent Labs Lab 02/26/15 0956 02/28/15 0513  NA 131* 134*  K 4.4 4.2  CL 91* 99*  CO2 27 25  GLUCOSE 144* 119*  BUN 9 7  CREATININE 0.92 0.86  CALCIUM 9.5 8.6*  AST 24  --   ALT 16*  --   ALKPHOS 90  --   BILITOT 1.8*  --    ------------------------------------------------------------------------------------------------------------------  Cardiac Enzymes No results for input(s): TROPONINI in the last 168 hours. ------------------------------------------------------------------------------------------------------------------  RADIOLOGY:  No results found.  EKG:  No orders found for this or any previous visit.  ASSESSMENT AND PLAN:   1. Sepsis with fever and leukocytosis unclear etiology Blood cultures 2 and urine culture and sensitivity negative Patient is on Zosyn IV Chest x-ray was normal -?inflammatory cyst  2.Left upper quadrant abdominal mass of unclear etiology, CT abd reveals 14/6/7 cm complex,  Loculated, cystic mass radiology recommends percutaneous bx/drainage of fluid  patient is  on Zosyn.  S/p  Gastroenterologist eval and had EGD 9/29  with extrinsic gastric body compression, no sample collected Surgery, Dr. 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 Oncology consult input noted. Spoke with Dr Denny Levy from IR about biopsy/fluid drainage on monday  2. Hyponatremia, likely dehydration related. Sodium level significant improved to 134 with IV fluids.   3.nicotine dependence- counselled pt  to quit smoking for 3-5 min .  Provide nicotine patch  4. ETOH dependence - op alcohol rehab recommended  All the records are reviewed and case discussed with Care Management/Social Workerr. Management plans discussed with the patient, family and they are in agreement.  CODE STATUS: full  TOTAL TIME TAKING CARE OF THIS PATIENT: 35 minutes.    Nica Friske M.D on 03/01/2015 at 5:30 PM  Between 7am to 6pm - Pager - [phone] After 6pm go to [email or web address] - password [email or web address] [facility]  [place] [facility]  Office  [phone]  CC: Primary care physician; No PCP Per Patient

## 2015-03-01 NOTE — Plan of Care (Signed)
Problem: Discharge Progression Outcomes Goal: Other Discharge Outcomes/Goals Outcome: Progressing Pt is alert and oriented, c/o abdominal pain, morphine given as needed with relief. Independent in room, encouraged to call for assistance when needed. Pt had fever of 101.5 degrees Farenheit at 2135, Tylenol given with improvement. Continue to assess.

## 2015-03-01 NOTE — Plan of Care (Signed)
Problem: Discharge Progression Outcomes Goal: Other Discharge Outcomes/Goals Outcome: Progressing 1. Plan to discharge home when care complete. MD advises plan for pt remain hospitalized until biopsy is done on Monday. 2. Denies co's pain/declines offer of pain meds. 3. WBC count trending down; Zosyn IV continued. 1st hemoccult stool-negative. Sent second one just now. No sign/symptom bleeding noted.  VSS except febrile with temp 100.1 orally. Continued low grade fever. 4. Up ad lib in room/tolerating well.

## 2015-03-01 NOTE — Progress Notes (Signed)
Patient being followed by oncology and surgery. The patient had an EGD that did not show a intraluminal lesion. Nothing further to do from a GI point of view will sign off at this time.

## 2015-03-02 LAB — URINE CULTURE: CULTURE: NO GROWTH

## 2015-03-02 LAB — OCCULT BLOOD X 1 CARD TO LAB, STOOL: Fecal Occult Bld: POSITIVE — AB

## 2015-03-02 MED ORDER — AMOXICILLIN-POT CLAVULANATE 875-125 MG PO TABS
1.0000 | ORAL_TABLET | Freq: Two times a day (BID) | ORAL | Status: DC
Start: 2015-03-02 — End: 2015-03-04
  Administered 2015-03-02 – 2015-03-03 (×3): 1 via ORAL
  Filled 2015-03-02 (×4): qty 1

## 2015-03-02 NOTE — Plan of Care (Signed)
Problem: Discharge Progression Outcomes Goal: Other Discharge Outcomes/Goals Outcome: Progressing 1. Plans to return to home; self care. 2. Denies pain currently. 3. VSS. MD aware of last stool  Hemoccult positive. 4. Augmentin po started; discontinued IV zosyn.  5. Up in room and tolerated well. 6. Hold heparin after afternoon dose, make pt NPO after MN for biopsy scheduled for tomorrow.

## 2015-03-02 NOTE — Progress Notes (Signed)
[facility] Physicians - [place] at [facility]   PATIENT NAME: William Marquez    MR#:  [id number]  DATE OF BIRTH:  11-27-1955  SUBJECTIVE:  No complaints. Appetite ok.   REVIEW OF SYSTEMS:  CONSTITUTIONAL: No fever, fatigue or weakness.  EYES: No blurred or double vision.  EARS, NOSE, AND THROAT: No tinnitus or ear pain.  RESPIRATORY: No cough, shortness of breath, wheezing or hemoptysis.  CARDIOVASCULAR: No chest pain, orthopnea, edema.  GASTROINTESTINAL: No nausea, vomiting, diarrhea  LUQ mass and  improved abdominal pain.  GENITOURINARY: No dysuria, hematuria.  ENDOCRINE: No polyuria, nocturia,  HEMATOLOGY: No anemia, easy bruising or bleeding SKIN: No rash or lesion. MUSCULOSKELETAL: No joint pain or arthritis.   NEUROLOGIC: No tingling, numbness, weakness.  PSYCHIATRY: No anxiety or depression.   DRUG ALLERGIES:  No Known Allergies  VITALS:  Blood pressure 136/66, pulse 75, temperature 98.5 F (36.9 C), temperature source Oral, resp. rate 20, height  (1.753 m), weight 78.019 kg (172 lb), SpO2 96 %.  PHYSICAL EXAMINATION:  GENERAL:  59 y.o.-year-old patient lying in the bed with no acute distress.  EYES: Pupils equal, round, reactive to light and accommodation. No scleral icterus. Extraocular muscles intact.  HEENT: Head atraumatic, normocephalic. Oropharynx and nasopharynx clear.  NECK:  Supple, no jugular venous distention. No thyroid enlargement, no tenderness.  LUNGS: Normal breath sounds bilaterally, no wheezing, rales,rhonchi or crepitation. No use of accessory muscles of respiration.  CARDIOVASCULAR: S1, S2 normal. No murmurs, rubs, or gallops.  ABDOMEN: Soft, LUQ is tender, nondistended. Bowel sounds present. LUQ  mass.  EXTREMITIES: No pedal edema, cyanosis, or clubbing.  NEUROLOGIC: Cranial nerves II through XII are intact. Muscle strength 5/5 in all extremities. Sensation intact. Gait not checked.  PSYCHIATRIC: The patient is alert and  oriented x 3.  SKIN: No obvious rash, lesion, or ulcer.    LABORATORY PANEL:   CBC  Recent Labs Lab 03/01/15 0443  WBC 13.8*  HGB 11.7*  HCT 35.7*  PLT 423   ------------------------------------------------------------------------------------------------------------------  Chemistries   Recent Labs Lab 02/26/15 0956 02/28/15 0513  NA 131* 134*  K 4.4 4.2  CL 91* 99*  CO2 27 25  GLUCOSE 144* 119*  BUN 9 7  CREATININE 0.92 0.86  CALCIUM 9.5 8.6*  AST 24  --   ALT 16*  --   ALKPHOS 90  --   BILITOT 1.8*  --    ------------------------------------------------------------------------------------------------------------------  Cardiac Enzymes No results for input(s): TROPONINI in the last 168 hours. ------------------------------------------------------------------------------------------------------------------  RADIOLOGY:  No results found.  EKG:  No orders found for this or any previous visit.  ASSESSMENT AND PLAN:   1. Sepsis with fever and leukocytosis unclear etiology Blood cultures 2 and urine culture and sensitivity negative Patient is on Zosyn IV Chest x-ray was normal -?inflammatory cyst  2.Left upper quadrant abdominal mass of unclear etiology, CT abd reveals 14/6/7 cm complex,  Loculated, cystic mass radiology recommends percutaneous bx/drainage of fluid  patient is  on Zosyn.  S/p  Gastroenterologist eval and had EGD 9/29  with extrinsic gastric body compression, no sample collected Surgery, Dr. 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 Oncology consult input noted. Spoke with Dr Denny Levy from IR about biopsy/fluid drainage on monday  2. Hyponatremia, likely dehydration related. Sodium level significant improved to 134 with IV fluids.   3.nicotine dependence- counselled pt to quit  smoking for 3-5 min . Provide  nicotine patch  4. ETOH dependence - op alcohol rehab recommended  All the records are reviewed and case discussed with Care Management/Social Workerr. Management plans discussed with the patient and  in agreement.  CODE STATUS: full  TOTAL TIME TAKING CARE OF THIS PATIENT: 35 minutes.    Tierany Appleby M.D on 03/02/2015 at 10:37 AM  Between 7am to 6pm - Pager - [phone] After 6pm go to [email or web address] - password [email or web address] [facility]  [place] [facility]  Office  [phone]  CC: Primary care physician; No PCP Per Patient

## 2015-03-02 NOTE — Plan of Care (Signed)
Problem: Discharge Progression Outcomes Goal: Other Discharge Outcomes/Goals Outcome: Progressing Pt is alert and oriented, c/o abdominal pain. Morphine given with relief. Independent in room. 3rd stool sample collected for blood occult. VSS, resting quietly.

## 2015-03-03 ENCOUNTER — Inpatient Hospital Stay: Payer: Self-pay

## 2015-03-03 ENCOUNTER — Encounter: Payer: Self-pay | Admitting: Radiology

## 2015-03-03 LAB — APTT: APTT: 27 s (ref 24–36)

## 2015-03-03 LAB — CANCER ANTIGEN 19-9: CA 19 9: 18 U/mL (ref 0–35)

## 2015-03-03 MED ORDER — FENTANYL CITRATE (PF) 100 MCG/2ML IJ SOLN
INTRAMUSCULAR | Status: AC
  Filled 2015-03-03: qty 4

## 2015-03-03 MED ORDER — MIDAZOLAM HCL 5 MG/5ML IJ SOLN
INTRAMUSCULAR | Status: AC
  Filled 2015-03-03: qty 5

## 2015-03-03 MED ORDER — SODIUM CHLORIDE 0.9 % IV SOLN
INTRAVENOUS | Status: DC
  Administered 2015-03-03: 11:00:00 via INTRAVENOUS

## 2015-03-03 NOTE — Progress Notes (Signed)
Nutrition Follow-up  DOCUMENTATION CODES:   Severe malnutrition in context of acute illness/injury  INTERVENTION:   Meals and Snacks: Cater to patient preferences once diet order able to be advanced Medical Food Supplement Therapy: continue Ensure as ordered once able to advance diet   NUTRITION DIAGNOSIS:   Malnutrition related to acute illness as evidenced by energy intake < or equal to 50% for > or equal to 5 days, percent weight loss.  GOAL:   Patient will meet greater than or equal to 90% of their needs  MONITOR:    (Energy Intake, anthropometrics, Digestive system)   ASSESSMENT:   Pt scheduled for biopsy and possible percutaneous drain placement today. Per surgery MD note recommending transfer to tertiary care facility for extensive resection of gastric mass.  Diet Order:  Diet NPO time specified    Current Nutrition: Pt reports drinking Ensure eating ice cream last night and tolerating well but not wanting any more than this currently. Recorded po intake on average since admission 28% of meals.   Gastrointestinal Profile: Last BM: 03/02/2015   Medications: NS 3% injection  Electrolyte/Renal Profile and Glucose Profile:   Recent Labs Lab 02/26/15 0956 02/28/15 0513  NA 131* 134*  K 4.4 4.2  CL 91* 99*  CO2 27 25  BUN 9 7  CREATININE 0.92 0.86  CALCIUM 9.5 8.6*  GLUCOSE 144* 119*   Protein Profile:  Recent Labs Lab 02/26/15 0956  ALBUMIN 3.1*     Weight Trend since Admission: Filed Weights   02/26/15 1607  Weight: 172 lb (78.019 kg)     Skin:  Reviewed, no issues   BMI:  Body mass index is 25.39 kg/(m^2).  Estimated Nutritional Needs:   Kcal:  BEE: 1585kcals, TEE: (IF 1.1-1.3)(AF 1.2) 1308-6578IONGE  Protein:  78-94g protein (1.0-1.2g/kg)  Fluid:  1950-2384mL of fluid (25-33mL/kg)   HIGH Care Level  Leda Quail, RD, LDN Pager [phone]

## 2015-03-03 NOTE — Sedation Documentation (Signed)
8.5 Fr multi purpose drain placed

## 2015-03-03 NOTE — Procedures (Signed)
Ct guided aspiration and drain placement  Complications:  None  Blood Loss: none  See dictation in canopy pacs

## 2015-03-03 NOTE — Progress Notes (Signed)
[facility] Physicians -  at [facility]   PATIENT NAME: William Marquez    MR#:  [id number]  DATE OF BIRTH:  09-23-1955  SUBJECTIVE:  No complaints. Appetite ok. Denies any abdominal pain today. nPO currently  REVIEW OF SYSTEMS:  CONSTITUTIONAL: No fever, fatigue or weakness.  EYES: No blurred or double vision.  EARS, NOSE, AND THROAT: No tinnitus or ear pain.  RESPIRATORY: No cough, shortness of breath, wheezing or hemoptysis.  CARDIOVASCULAR: No chest pain, orthopnea, edema.  GASTROINTESTINAL: No nausea, vomiting, diarrhea  GENITOURINARY: No dysuria, hematuria.  ENDOCRINE: No polyuria, nocturia,  HEMATOLOGY: No anemia, easy bruising or bleeding SKIN: No rash or lesion. MUSCULOSKELETAL: No joint pain or arthritis.   NEUROLOGIC: No tingling, numbness, weakness.  PSYCHIATRY: No anxiety or depression.   DRUG ALLERGIES:  No Known Allergies  VITALS:  Blood pressure 140/64, pulse 73, temperature 99.1 F (37.3 C), temperature source Oral, resp. rate 20, height  (1.753 m), weight 78.019 kg (172 lb), SpO2 97 %.  PHYSICAL EXAMINATION:  GENERAL:  59 y.o.-year-old patient lying in the bed with no acute distress.  EYES: Pupils equal, round, reactive to light and accommodation. No scleral icterus. Extraocular muscles intact.  HEENT: Head atraumatic, normocephalic. Oropharynx and nasopharynx clear.  NECK:  Supple, no jugular venous distention. No thyroid enlargement, no tenderness.  LUNGS: Normal breath sounds bilaterally, no wheezing, rales,rhonchi or crepitation. No use of accessory muscles of respiration.  CARDIOVASCULAR: S1, S2 normal. No murmurs, rubs, or gallops.  ABDOMEN: Soft, LUQ is tender, nondistended. Bowel sounds present. LUQ  mass.  EXTREMITIES: No pedal edema, cyanosis, or clubbing.  NEUROLOGIC: Cranial nerves II through XII are intact. Muscle strength 5/5 in all extremities.  Sensation intact. Gait not checked.  PSYCHIATRIC: patient is alert and  oriented x 3.  SKIN: No obvious rash, lesion, or ulcer.    LABORATORY PANEL:   CBC  Recent Labs Lab 03/01/15 0443  WBC 13.8*  HGB 11.7*  HCT 35.7*  PLT 423   ------------------------------------------------------------------------------------------------------------------  Chemistries   Recent Labs Lab 02/26/15 0956 02/28/15 0513  NA 131* 134*  K 4.4 4.2  CL 91* 99*  CO2 27 25  GLUCOSE 144* 119*  BUN 9 7  CREATININE 0.92 0.86  CALCIUM 9.5 8.6*  AST 24  --   ALT 16*  --   ALKPHOS 90  --   BILITOT 1.8*  --    ASSESSMENT AND PLAN:   1. Sepsis with fever and leukocytosis unclear etiology Blood cultures 2 and urine culture and sensitivity negative Patient is on Zosyn IV--->change to po augmentin Chest x-ray  normal -?inflammatory cyst  2.Left upper quadrant abdominal mass of unclear etiology CT abd reveals 14/6/7 cm complex, Loculated, cystic mass radiology recommends percutaneous bx/drainage of fluid/biopsy. Orders placed S/p  Gastroenterologist eval and had EGD 9/29  with extrinsic gastric body compression, no sample collected Surgery, Dr. 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 Oncology consult input noted.  2. Hyponatremia, likely dehydration related. Sodium level significant improved to 134 with IV fluids.  D/c ivf  3.nicotine dependence- counselled pt to quit smoking for 3-5 min . Provide nicotine patch  4. ETOH dependence - op alcohol rehab recommended  If remains stable will d/c to home after biopsy  All the records are reviewed and case discussed with Care Management/Social Workerr. Management plans discussed with the patient and  in agreement.  CODE STATUS: full  TOTAL TIME TAKING CARE OF THIS PATIENT: 25 minutes.    William Marquez M.D on 03/03/2015 at 7:53 AM  Between 7am to 6pm - Pager - [phone] After 6pm  go to [email or web address] - password [email or web address] [facility]  [place] [facility]  Office  [phone]  CC: Primary care physician; No PCP Per Patient

## 2015-03-03 NOTE — Progress Notes (Signed)
Patient cooperative with care.  Declined pain medications prior to procedure and during procedure and upon return to floor was having a great deal of pain.  Was controlled with IV pain medication.  Patient had bedrest orders following procedure that have been completed.  Plan to continue to observe drainage from jp drain and depending on that volume and possible repeat of scan will remove drain prior to discharging home.

## 2015-03-03 NOTE — Plan of Care (Signed)
Problem: Discharge Progression Outcomes Goal: Other Discharge Outcomes/Goals Outcome: Progressing Plan of care progress to goal: VSS pts pain controlled with Oxycodone IR NPO since midnight awaiting biopsy today UP ad lib

## 2015-03-04 DIAGNOSIS — A498 Other bacterial infections of unspecified site: Secondary | ICD-10-CM

## 2015-03-04 LAB — RAPID HIV SCREEN (HIV 1/2 AB+AG)
HIV 1/2 ANTIBODIES: NONREACTIVE
HIV-1 P24 ANTIGEN - HIV24: NONREACTIVE

## 2015-03-04 LAB — CYTOLOGY - NON PAP

## 2015-03-04 MED ORDER — PIPERACILLIN-TAZOBACTAM 3.375 G IVPB
3.3750 g | Freq: Three times a day (TID) | INTRAVENOUS | Status: DC
  Administered 2015-03-04 – 2015-03-05 (×3): 3.375 g via INTRAVENOUS
  Filled 2015-03-04 (×6): qty 50

## 2015-03-04 NOTE — Plan of Care (Signed)
Problem: Discharge Progression Outcomes Goal: Other Discharge Outcomes/Goals Outcome: Progressing Plan of care progress to goal: VSS Pain relief given once during shift JP drain puts out small amount, no drainage noted Activity - pt up ad lib

## 2015-03-04 NOTE — Progress Notes (Signed)
[facility] [place] at [facility]   PATIENT NAME: William Marquez    MR#:  [id number]  DATE OF BIRTH:  21-Feb-1956  SUBJECTIVE:  No complaints. Appetite ok. Denies any abdominal pain today. S/p left sided abd drain with thick secretion  REVIEW OF SYSTEMS:  CONSTITUTIONAL: No fever, fatigue or weakness.  EYES: No blurred or double vision.  EARS, NOSE, AND THROAT: No tinnitus or ear pain.  RESPIRATORY: No cough, shortness of breath, wheezing or hemoptysis.  CARDIOVASCULAR: No chest pain, orthopnea, edema.  GASTROINTESTINAL: No nausea, vomiting, diarrhea  GENITOURINARY: No dysuria, hematuria.  ENDOCRINE: No polyuria, nocturia,  HEMATOLOGY: No anemia, easy bruising or bleeding SKIN: No rash or lesion. MUSCULOSKELETAL: No joint pain or arthritis.   NEUROLOGIC: No tingling, numbness, weakness.  PSYCHIATRY: No anxiety or depression.   DRUG ALLERGIES:  No Known Allergies  VITALS:  Blood pressure 136/73, pulse 87, temperature 98.5 F (36.9 C), temperature source Oral, resp. rate 18, height  (1.753 m), weight 78.019 kg (172 lb), SpO2 97 %.  PHYSICAL EXAMINATION:  GENERAL:  59 y.o.-year-old patient lying in the bed with no acute distress.  EYES: Pupils equal, round, reactive to light and accommodation. No scleral icterus. Extraocular muscles intact.  HEENT: Head atraumatic, normocephalic. Oropharynx and nasopharynx clear.  NECK:  Supple, no jugular venous distention. No thyroid enlargement, no tenderness.  LUNGS: Normal breath sounds bilaterally, no wheezing, rales,rhonchi or crepitation. No use of accessory muscles of respiration.  CARDIOVASCULAR: S1, S2 normal. No murmurs, rubs, or gallops.  ABDOMEN: Soft, LUQ is tender, nondistended. Bowel sounds present. LUQ  mass. Left abd drain + EXTREMITIES: No pedal edema, cyanosis, or clubbing.  NEUROLOGIC: Cranial nerves II through XII are intact. Muscle strength 5/5 in all extremities.  Sensation intact. Gait not  checked.  PSYCHIATRIC: patient is alert and oriented x 3.  SKIN: No obvious rash, lesion, or ulcer.    LABORATORY PANEL:   CBC  Recent Labs Lab 03/01/15 0443  WBC 13.8*  HGB 11.7*  HCT 35.7*  PLT 423   ------------------------------------------------------------------------------------------------------------------  Chemistries   Recent Labs Lab 02/26/15 0956 02/28/15 0513  NA 131* 134*  K 4.4 4.2  CL 91* 99*  CO2 27 25  GLUCOSE 144* 119*  BUN 9 7  CREATININE 0.92 0.86  CALCIUM 9.5 8.6*  AST 24  --   ALT 16*  --   ALKPHOS 90  --   BILITOT 1.8*  --    ASSESSMENT AND PLAN:   1. Sepsis with fever and leukocytosis unclear etiology Blood cultures 2 and urine culture and sensitivity negative Patient is on Zosyn IV Chest x-ray  normal -?inflammatory cyst  2.Left upper quadrant abdominal mass of unclear etiology CT abd reveals 14/6/7 cm complex, Loculated, cystic mass -s/p CT guided percutaneous bx/drainage of fluid/biopsy -Fluid culture Heavy growth of Escherichia coli. -Cytology pending. S/p  Gastroenterologist eval and had EGD 9/29  with extrinsic gastric body compression, no sample collected Surgery, Dr. 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 Oncology consult input noted. -Spoke with Dr. Sampson Goon infectious disease to see patient.  2. Hyponatremia, likely dehydration related. Sodium level significant improved to 134 with IV fluids.  D/c ivf -Resolved 3.nicotine dependence- counselled pt to quit smoking for 3-5 min . Provide nicotine patch  4. ETOH dependence - op alcohol rehab recommended   All the records are reviewed and  case discussed with Care Management/Social Workerr. Management plans discussed with the patient and  in agreement.  CODE STATUS: full  TOTAL TIME TAKING CARE OF THIS PATIENT: 25 minutes.     Quilla Freeze M.D on 03/04/2015 at 12:06 PM  Between 7am to 6pm - Pager - [phone] After 6pm go to [email or web address] - password [email or web address] [facility]  [facility]  Office  [phone]  CC: Primary care physician; No PCP Per Patient

## 2015-03-04 NOTE — Consult Note (Signed)
ONCOLOGY follow-up note -  History of present illness: States he is feeling better overall, nausea is better. Eating better. ROS: Fever better. Had fluid drained by interventional radiology from cystic upper abdominal mass. Denies major pain issues. Exam: Alert and oriented, no acute distress.            Vitals - 98.5, 80, 20, 135/72, 99%            Lungs - bilateral good air entry            Abdomen - mild tenderness epigastrium, has drain placed from procedure yesterday.            Extremities - no edema  Labs:  Abdominal cystic mass fluid culture growing Escherichia coli. Cytology pending.            CA-19-9-9 normal at 18.  Impression/Recommendations: 59 year old gentleman admitted with severe upper abdominal pain, unintentional weight loss, fever and CT scan evaluation reporting a Complex multiloculated cystic mass in the left upper quadrant measuring 14.6 x 6.3 x 7.1 cm with mild peripheral enhancement, there is a component which partially encases the gastric antrum, the appearance is nonspecific and may reflect a pseudocyst versus multiloculated abscess versus cystic malignancy versus sequela of perforated ulcer. Patient does have history of alcohol intake but denies history of recurrent pancreatitis in the past. He denies any history of known malignancy in the past. EGD done reported esophagus and stomach as unremarkable, there was evidence of extrinsic compression on the stomach at gastric body.  Serum CA 19-9 level is unremarkable. Abdominal cystic mass fluid drained by radiology yesterday is growing Escherichia coli, patient is on IV Zosyn and is being followed by infectious disease specialist. Agree with ongoing treatment of infection, cytology is pending. Given the large size of the mass, he will need continued surveillance CT scan in the near future to make sure it is improving, otherwise may need further workup to rule out underlying malignancy. Oncology will continue to follow as  indicated. Patient is agreeable to this plan.

## 2015-03-04 NOTE — Progress Notes (Signed)
   03/04/15 0905  Clinical Encounter Type  Visited With Patient  Visit Type Initial  Referral From Nurse  Consult/Referral To Chaplain  Spiritual Encounters  Spiritual Needs Brochure  Advance Directives (For Healthcare)  Does patient have an advance directive? No  Would patient like information on creating an advanced directive? Yes - Educational materials given  Paged to visit with pt. Patient requested AD. Provided info. with instructions not to sign. but when ready, call for chaplain. Chap. Karl Ito [phone]

## 2015-03-04 NOTE — Plan of Care (Signed)
Problem: Discharge Progression Outcomes Goal: Other Discharge Outcomes/Goals Outcome: Progressing Plan of care progress to goal for: 1. Discharge Plan:         In Place and Appropriate.         Plan to Discharge to [facility] or [facility] for [facility]. 2. Pain:         Pain Controlled with Appropriate Interventions. 3. Hemodynamically Stable:         VSS.         Afebrile.         IV Antibiotics Initiated.         Labs Improving.          4. Complications:         JP LLQ with Scant amount of Serous Sanguineous Drainage. 5. Diet:         Regular Diet. Tolerating Well.          Thin Liquids. Tolerating Well.         Feeding Supplement Ensure Enlive provided. Tolerating Well. 6. Activity:         Independent.

## 2015-03-04 NOTE — Consult Note (Signed)
[facility] Infectious Disease     Reason for Consult:Abd  Mass, abscess    Referring Physician: Serita Grit Date of Admission:  02/26/2015   Principal Problem:   Abdominal mass Active Problems:   Leukocytosis   Loss of weight   Protein-calorie malnutrition, severe (HCC)   HPI: William Marquez is a 59 y.o. male with hx of migraines, prior LTBI [facility] in [facility] admitted 9/28 with abd pain ongoing for several weeks worsening over days.  He also had wt loss of 20-25 lbs over last several months, poor appetite, and fevers.   He had one episode of diarrhea several weeks ago but this resolved. On admit found to have large mass near stomach, wbc of 17 and fevers. Seen by GI and surg. Had EGD which showed ext compression of antrum. Had drain placed with purulence found and cx with e coli. He works at [employer]. No recent travel. No sick contacts.   Past Medical History  Diagnosis Date  . Headache    Past Surgical History  Procedure Laterality Date  . Esophagogastroduodenoscopy (egd) with propofol N/A 02/27/2015    Procedure: ESOPHAGOGASTRODUODENOSCOPY (EGD) WITH PROPOFOL;  Surgeon: Lucilla Lame, MD;  Location: [facility] ENDOSCOPY;  Service: Endoscopy;  Laterality: N/A;   Social History  Substance Use Topics  . Smoking status: Current Every Day Smoker  . Smokeless tobacco: None  . Alcohol Use: Yes   History reviewed. No pertinent family history.  Allergies: No Known Allergies  Current antibiotics: Antibiotics Given (last 72 hours)    Date/Time Action Medication Dose Rate   03/01/15 2216 Given   piperacillin-tazobactam (ZOSYN) IVPB 3.375 g 3.375 g 12.5 mL/hr   03/02/15 0526 Given   piperacillin-tazobactam (ZOSYN) IVPB 3.375 g 3.375 g 12.5 mL/hr   03/02/15 0957 Given   amoxicillin-clavulanate (AUGMENTIN) 875-125 MG per tablet 1 tablet 1 tablet    03/02/15 2033 Given  [pt preference]   amoxicillin-clavulanate (AUGMENTIN) 875-125 MG per tablet 1 tablet 1 tablet    03/03/15 2240  Given   amoxicillin-clavulanate (AUGMENTIN) 875-125 MG per tablet 1 tablet 1 tablet    03/04/15 1434 Given   piperacillin-tazobactam (ZOSYN) IVPB 3.375 g 3.375 g 12.5 mL/hr      MEDICATIONS: . feeding supplement (ENSURE ENLIVE)  237 mL Oral BID BM  . nicotine  21 mg Transdermal Daily  . piperacillin-tazobactam (ZOSYN)  IV  3.375 g Intravenous 3 times per day  . sodium chloride  3 mL Intravenous Q12H    Review of Systems - 11 systems reviewed and negative per HPI   OBJECTIVE: Temp:  [98.5 F (36.9 C)-98.9 F (37.2 C)] 98.5 F (36.9 C) (10/04 1244) Pulse Rate:  [78-87] 80 (10/04 1244) Resp:  [18-20] 20 (10/04 1244) BP: (135-139)/(68-73) 135/72 mmHg (10/04 1244) SpO2:  [95 %-99 %] 99 % (10/04 1244) Physical Exam  Constitutional: He is oriented to person, place, and time. He appears well-developed and well-nourished. No distress.  HENT:  Mouth/Throat: Oropharynx is clear and moist. No oropharyngeal exudate.  Cardiovascular: Normal rate, regular rhythm and normal heart sounds. Exam reveals no gallop and no friction rub.  No murmur heard.  Pulmonary/Chest: Effort normal and breath sounds normal. No respiratory distress. He has no wheezes.  Abdominal: Soft. Bowel sounds are normal. Mild ttp epigastrium, has drain RUQ with purulence Lymphadenopathy:  He has no cervical adenopathy.  Neurological: He is alert and oriented to person, place, and time.  Skin: Skin is warm and dry. No rash noted. No erythema.  Psychiatric: He  has a normal mood and affect. His behavior is normal.    LABS: Results for orders placed or performed during the hospital encounter of 02/26/15 (from the past 48 hour(s))  APTT     Status: None   Collection Time: 03/03/15  9:12 AM  Result Value Ref Range   aPTT 27 24 - 36 seconds  Culture, routine-abscess     Status: None (Preliminary result)   Collection Time: 03/03/15 12:45 PM  Result Value Ref Range   Specimen Description ABDOMEN    Special Requests  Normal    Gram Stain MANY GRAM NEGATIVE RODS MANY WBC SEEN     Culture      HEAVY GROWTH ESCHERICHIA COLI SUSCEPTIBILITIES TO FOLLOW    Report Status PENDING    No components found for: ESR, C REACTIVE PROTEIN MICRO: Recent Results (from the past 720 hour(s))  Culture, blood (routine x 2)     Status: None (Preliminary result)   Collection Time: 02/28/15 10:32 AM  Result Value Ref Range Status   Specimen Description BLOOD RIGHT ARM  Final   Special Requests AER 5CC ANA 5CC  Final   Culture NO GROWTH 3 DAYS  Final   Report Status PENDING  Incomplete  Culture, blood (routine x 2)     Status: None (Preliminary result)   Collection Time: 02/28/15 10:43 AM  Result Value Ref Range Status   Specimen Description BLOOD RIGHT ARM  Final   Special Requests AER 5CC ANA 5CC  Final   Culture NO GROWTH 3 DAYS  Final   Report Status PENDING  Incomplete  Urine culture     Status: None   Collection Time: 02/28/15 11:33 AM  Result Value Ref Range Status   Specimen Description URINE, CLEAN CATCH  Final   Special Requests Immunocompromised  Final   Culture NO GROWTH 2 DAYS  Final   Report Status 03/02/2015 FINAL  Final  Culture, routine-abscess     Status: None (Preliminary result)   Collection Time: 03/03/15 12:45 PM  Result Value Ref Range Status   Specimen Description ABDOMEN  Final   Special Requests Normal  Final   Gram Stain MANY GRAM NEGATIVE RODS MANY WBC SEEN   Final   Culture   Final    HEAVY GROWTH ESCHERICHIA COLI SUSCEPTIBILITIES TO FOLLOW    Report Status PENDING  Incomplete    IMAGING: Dg Ribs Unilateral W/chest Left  02/26/2015   CLINICAL DATA:  Left anterior rib pain after lifting television on Sunday. Worse with deep breathing and cough.  EXAM: LEFT RIBS AND CHEST - 3+ VIEW  COMPARISON:  None.  FINDINGS: Scarring in the left apex with apical bulla. Lungs otherwise clear. No effusions or pneumothorax. Heart is normal size.  No acute bony abnormality.  No visible rib  fracture.  IMPRESSION: Left apical bulla and scarring.  No acute findings.   Electronically Signed   By: Rolm Baptise M.D.   On: 02/26/2015 10:30   Ct Chest W Contrast  02/26/2015   CLINICAL DATA:  Epigastric pain, left lower quadrant pain, left upper quadrant pain, elevated WBC  EXAM: CT CHEST, ABDOMEN, AND PELVIS WITH CONTRAST  TECHNIQUE: Multidetector CT imaging of the chest, abdomen and pelvis was performed following the standard protocol during bolus administration of intravenous contrast.  CONTRAST:  164m OMNIPAQUE IOHEXOL 300 MG/ML  SOLN  COMPARISON:  None.  FINDINGS: CT CHEST FINDINGS  Mediastinum/Nodes: No mediastinal hematoma. No lymphadenopathy. Normal caliber thoracic aorta. Normal heart size.  Lungs/Pleura: Bilateral  centrilobular emphysema. No focal consolidation, pleural effusion or pneumothorax.  Musculoskeletal: No acute osseous abnormality. No aggressive lytic or sclerotic osseous lesion. Maintained vertebral body heights and normal anatomic alignment.  CT ABDOMEN PELVIS FINDINGS  Hepatobiliary: Decreased hepatic echogenicity as can be seen with hepatic steatosis. More focal area of low attenuation in the falciform ligament measuring 2 cm likely reflecting a more focal area of steatosis. No intrahepatic or extrahepatic biliary ductal dilatation.  Pancreas: Pancreas enhances normally and homogeneously.  Spleen: Normal.  Adrenals/Urinary Tract: Normal adrenal glands. Normal kidneys. No urolithiasis or obstructive uropathy. Normal bladder.  Stomach/Bowel: No bowel wall thickening or dilatation. No abdominal or pelvic free fluid. Normal appendix. No pneumatosis, pneumoperitoneum or portal venous gas.  Vascular/Lymphatic: Normal caliber abdominal aorta with atherosclerosis. Numerous small non pathologically enlarged retroperitoneal lymph nodes with the largest measuring 8 mm in short axis along the left para-aortic region.  Prostate: Enlarged prostate gland measuring 5.7 x 3.6 cm.  Other: There is a  complex multiloculated cystic mass in the left upper quadrant measuring 14.6 x 6.3 x 7.1 cm with peripheral enhancement. There is a component which partially encases the gastric antrum.  Musculoskeletal: No acute osseous abnormality. Mild osteoarthritis of the left hip. No aggressive lytic or sclerotic osseous lesion. Degenerative disc disease with disc height loss and bilateral facet arthropathy at L5-S1.  IMPRESSION: 1. Complex multiloculated cystic mass in the left upper quadrant measuring 14.6 x 6.3 x 7.1 cm with mild peripheral enhancement. There is a component which partially encases the gastric antrum. The appearance is nonspecific and may reflect a pseudocyst versus multiloculated abscess versus cystic malignancy versus sequela of perforated ulcer. Percutaneous fluid sampling would L delineate between these etiologies. 2. Hepatic steatosis.   Electronically Signed   By: Kathreen Devoid   On: 02/26/2015 14:34   Ct Abdomen Pelvis W Contrast  02/26/2015   CLINICAL DATA:  Epigastric pain, left lower quadrant pain, left upper quadrant pain, elevated WBC  EXAM: CT CHEST, ABDOMEN, AND PELVIS WITH CONTRAST  TECHNIQUE: Multidetector CT imaging of the chest, abdomen and pelvis was performed following the standard protocol during bolus administration of intravenous contrast.  CONTRAST:  171m OMNIPAQUE IOHEXOL 300 MG/ML  SOLN  COMPARISON:  None.  FINDINGS: CT CHEST FINDINGS  Mediastinum/Nodes: No mediastinal hematoma. No lymphadenopathy. Normal caliber thoracic aorta. Normal heart size.  Lungs/Pleura: Bilateral centrilobular emphysema. No focal consolidation, pleural effusion or pneumothorax.  Musculoskeletal: No acute osseous abnormality. No aggressive lytic or sclerotic osseous lesion. Maintained vertebral body heights and normal anatomic alignment.  CT ABDOMEN PELVIS FINDINGS  Hepatobiliary: Decreased hepatic echogenicity as can be seen with hepatic steatosis. More focal area of low attenuation in the falciform  ligament measuring 2 cm likely reflecting a more focal area of steatosis. No intrahepatic or extrahepatic biliary ductal dilatation.  Pancreas: Pancreas enhances normally and homogeneously.  Spleen: Normal.  Adrenals/Urinary Tract: Normal adrenal glands. Normal kidneys. No urolithiasis or obstructive uropathy. Normal bladder.  Stomach/Bowel: No bowel wall thickening or dilatation. No abdominal or pelvic free fluid. Normal appendix. No pneumatosis, pneumoperitoneum or portal venous gas.  Vascular/Lymphatic: Normal caliber abdominal aorta with atherosclerosis. Numerous small non pathologically enlarged retroperitoneal lymph nodes with the largest measuring 8 mm in short axis along the left para-aortic region.  Prostate: Enlarged prostate gland measuring 5.7 x 3.6 cm.  Other: There is a complex multiloculated cystic mass in the left upper quadrant measuring 14.6 x 6.3 x 7.1 cm with peripheral enhancement. There is a component which partially encases the  gastric antrum.  Musculoskeletal: No acute osseous abnormality. Mild osteoarthritis of the left hip. No aggressive lytic or sclerotic osseous lesion. Degenerative disc disease with disc height loss and bilateral facet arthropathy at L5-S1.  IMPRESSION: 1. Complex multiloculated cystic mass in the left upper quadrant measuring 14.6 x 6.3 x 7.1 cm with mild peripheral enhancement. There is a component which partially encases the gastric antrum. The appearance is nonspecific and may reflect a pseudocyst versus multiloculated abscess versus cystic malignancy versus sequela of perforated ulcer. Percutaneous fluid sampling would L delineate between these etiologies. 2. Hepatic steatosis.   Electronically Signed   By: Kathreen Devoid   On: 02/26/2015 14:34   Ct Biopsy  03/03/2015   CLINICAL DATA:  Multiloculated left upper quadrant fluid collection  EXAM: CT GUIDED ASPIRATION AND SUBSEQUENT DRAINAGE OF LEFT MID ABDOMINAL FLUID COLLECTION  ANESTHESIA/SEDATION: None  PROCEDURE:  The procedure risks, benefits, and alternatives were explained to the patient. Questions regarding the procedure were encouraged and answered. The patient understands and consents to the procedure.  The left mid abdomen was prepped with chlorhexidinein a sterile fashion, and a sterile drape was applied covering the operative field. A sterile gown and sterile gloves were used for the procedure. Local anesthesia was provided with 1% Lidocaine.  Abdomen utilizing CT fluoroscopic guidance an 18 gauge trocar needle was placed into 1 of the loculated collections in the left mid abdomen along the left pericolic gutter. Aspiration was performed and yielded purulent fluid which was not malodorous. Given anterior in nature of the fluid it was felt a drainage catheter showed the placed. A Bentson guidewire was then placed through the needle and coiled within the collection. Over this, an 8 [place] multipurpose drain was placed and attached to a suction grenade. This was sutured into place utilizing 0 Prolene. The patient tolerated the procedure well and was returned his room in satisfactory condition.  COMPLICATIONS: None immediate  FINDINGS: Persistent multiloculated fluid collection in the left abdomen. A drainage catheter was placed as described above.  IMPRESSION: Successful placement of an 8 French drainage catheter in the left mid abdominal fluid collection. Purulent fluid was withdrawn which was sent for laboratory evaluation. Correlation with the amount of drainage over time is recommended as well as with the results of the culture and Gram stain. If no significant drainage is noted and the culture results are negative, the tube can be removed. Followup scanning would be helpful prior to tube removal. These findings were discussed with Dr. Posey Pronto.   Electronically Signed   By: Inez Catalina M.D.   On: 03/03/2015 14:17    Assessment:   Fillmore Bynum is a 59 y.o. male with abd mass/abscess, fevers, leukocytosis and wt  loss. 9/29 had EGD with extrinsic compression of his stomach but no masses within and no ulcers.  Had drain placed and cx is growing E coli. It is unclear whether or not this is a malignancy with superinfection or just an abscess.  Recommendations Check HIV Await cx results but likely can be treated with IV ceftriaxone if sensitive WIll likely need picc line and at least 2 weeks of IV abx with repeat imaging per IR and drain removal once improving.  Will then likely need several weeks of oral abx as well Thank you very much for allowing me to participate in the care of this patient. Please call with questions.   Cheral Marker. Ola Spurr, MD

## 2015-03-04 NOTE — Consult Note (Signed)
ANTIBIOTIC CONSULT NOTE - INITIAL  Pharmacy Consult for zosyn Indication: intra abdominal infection  No Known Allergies  Patient Measurements: Height:  (175.3 cm) Weight: 172 lb (78.019 kg) IBW/kg (Calculated) : 70.7 Adjusted Body Weight:   Vital Signs: Temp: 98.9 F (37.2 C) (10/04 0515) Temp Source: Oral (10/04 0515) BP: 139/72 mmHg (10/04 0515) Pulse Rate: 79 (10/04 0515) Intake/Output from previous day:   Intake/Output from this shift:    Labs: No results for input(s): WBC, HGB, PLT, LABCREA, CREATININE in the last 72 hours. Estimated Creatinine Clearance: 93.6 mL/min (by C-G formula based on Cr of 0.86). No results for input(s): VANCOTROUGH, VANCOPEAK, VANCORANDOM, GENTTROUGH, GENTPEAK, GENTRANDOM, TOBRATROUGH, TOBRAPEAK, TOBRARND, AMIKACINPEAK, AMIKACINTROU, AMIKACIN in the last 72 hours.   Microbiology: Recent Results (from the past 720 hour(s))  Culture, blood (routine x 2)     Status: None (Preliminary result)   Collection Time: 02/28/15 10:32 AM  Result Value Ref Range Status   Specimen Description BLOOD RIGHT ARM  Final   Special Requests AER 5CC ANA 5CC  Final   Culture NO GROWTH 3 DAYS  Final   Report Status PENDING  Incomplete  Culture, blood (routine x 2)     Status: None (Preliminary result)   Collection Time: 02/28/15 10:43 AM  Result Value Ref Range Status   Specimen Description BLOOD RIGHT ARM  Final   Special Requests AER 5CC ANA 5CC  Final   Culture NO GROWTH 3 DAYS  Final   Report Status PENDING  Incomplete  Urine culture     Status: None   Collection Time: 02/28/15 11:33 AM  Result Value Ref Range Status   Specimen Description URINE, CLEAN CATCH  Final   Special Requests Immunocompromised  Final   Culture NO GROWTH 2 DAYS  Final   Report Status 03/02/2015 FINAL  Final    Medical History: Past Medical History  Diagnosis Date  . Headache     Medications:  Scheduled:  . feeding supplement (ENSURE ENLIVE)  237 mL Oral BID BM  .  nicotine  21 mg Transdermal Daily  . sodium chloride  3 mL Intravenous Q12H   Assessment: Pt is a 59 year old male with abdominal pain/mass. Pt was on zosyn from 9/29-10/2, transitioned to augmentin and now being switched back to zosyn per hospitalist. Pharmacy consulted to dose  Goal of Therapy:  resolution of infection  Plan:  zosyn 3.375g q 8 hours EI infusion. pharmacy to continue to monitor  Aloria Looper D Camauri Craton 03/04/2015,8:48 AM

## 2015-03-05 ENCOUNTER — Inpatient Hospital Stay: Payer: Self-pay

## 2015-03-05 ENCOUNTER — Inpatient Hospital Stay: Payer: Medicaid Other

## 2015-03-05 LAB — BASIC METABOLIC PANEL
Anion gap: 9 (ref 5–15)
BUN: 11 mg/dL (ref 6–20)
CHLORIDE: 98 mmol/L — AB (ref 101–111)
CO2: 28 mmol/L (ref 22–32)
CREATININE: 0.94 mg/dL (ref 0.61–1.24)
Calcium: 9.6 mg/dL (ref 8.9–10.3)
Glucose, Bld: 147 mg/dL — ABNORMAL HIGH (ref 65–99)
Potassium: 4.8 mmol/L (ref 3.5–5.1)
SODIUM: 135 mmol/L (ref 135–145)

## 2015-03-05 LAB — CBC
HCT: 38.7 % — ABNORMAL LOW (ref 40.0–52.0)
HEMOGLOBIN: 12.7 g/dL — AB (ref 13.0–18.0)
MCH: 32.5 pg (ref 26.0–34.0)
MCHC: 32.9 g/dL (ref 32.0–36.0)
MCV: 98.8 fL (ref 80.0–100.0)
Platelets: 470 10*3/uL — ABNORMAL HIGH (ref 150–440)
RBC: 3.92 MIL/uL — AB (ref 4.40–5.90)
RDW: 13.8 % (ref 11.5–14.5)
WBC: 9.1 10*3/uL (ref 3.8–10.6)

## 2015-03-05 LAB — CULTURE, BLOOD (ROUTINE X 2)
CULTURE: NO GROWTH
Culture: NO GROWTH

## 2015-03-05 MED ORDER — SODIUM CHLORIDE 0.9 % IV SOLN
3.0000 g | Freq: Four times a day (QID) | INTRAVENOUS | Status: DC
  Administered 2015-03-05: 15:00:00 3 g via INTRAVENOUS
  Filled 2015-03-05 (×4): qty 3

## 2015-03-05 NOTE — Progress Notes (Signed)
[facility] CLINIC INFECTIOUS DISEASE PROGRESS NOTE Date of Admission:  02/26/2015     ID: William Marquez is a 59 y.o. male with abd abscess  Principal Problem:   Abdominal mass Active Problems:   Leukocytosis   Loss of weight   Protein-calorie malnutrition, severe (HCC)   Subjective: Still with some abd pain, no fevers. Picc placed. To be transferred to [facility]   ROS  Eleven systems are reviewed and negative except per hpi  Medications:  Antibiotics Given (last 72 hours)    Date/Time Action Medication Dose Rate   03/02/15 2033 Given  [pt preference]   amoxicillin-clavulanate (AUGMENTIN) 875-125 MG per tablet 1 tablet 1 tablet    03/03/15 2240 Given   amoxicillin-clavulanate (AUGMENTIN) 875-125 MG per tablet 1 tablet 1 tablet    03/04/15 1434 Given   piperacillin-tazobactam (ZOSYN) IVPB 3.375 g 3.375 g 12.5 mL/hr   03/04/15 2213 Given   piperacillin-tazobactam (ZOSYN) IVPB 3.375 g 3.375 g 12.5 mL/hr   03/05/15 0558 Given   piperacillin-tazobactam (ZOSYN) IVPB 3.375 g 3.375 g 12.5 mL/hr     . feeding supplement (ENSURE ENLIVE)  237 mL Oral BID BM  . nicotine  21 mg Transdermal Daily  . piperacillin-tazobactam (ZOSYN)  IV  3.375 g Intravenous 3 times per day  . sodium chloride  3 mL Intravenous Q12H    Objective: Vital signs in last 24 hours: Temp:  [97.9 F (36.6 C)-98.7 F (37.1 C)] 97.9 F (36.6 C) (10/05 0445) Pulse Rate:  [79-113] 111 (10/05 0447) Resp:  [20-22] 22 (10/05 0445) BP: (90-141)/(58-62) 99/62 mmHg (10/05 0447) SpO2:  [95 %-97 %] 95 % (10/05 0445) Constitutional: He is oriented to person, place, and time. He appears well-developed and well-nourished. No distress.  HENT:  Mouth/Throat: Oropharynx is clear and moist. No oropharyngeal exudate.  Cardiovascular: Normal rate, regular rhythm and normal heart sounds. Exam reveals no gallop and no friction rub.  No murmur heard.  Pulmonary/Chest: Effort normal and breath sounds normal. No respiratory distress.  He has no wheezes.  Abdominal: Soft. Bowel sounds are normal. Mild ttp epigastrium, has drain RUQ with purulence Lymphadenopathy:  He has no cervical adenopathy.  Neurological: He is alert and oriented to person, place, and time.  Skin: Skin is warm and dry. No rash noted. No erythema.  Psychiatric: He has a normal mood and affect. His behavior is normal.   Lab Results  Recent Labs  03/05/15 0520  WBC 9.1  HGB 12.7*  HCT 38.7*  NA 135  K 4.8  CL 98*  CO2 28  BUN 11  CREATININE 0.94    Microbiology: Results for orders placed or performed during the hospital encounter of 02/26/15  Culture, blood (routine x 2)     Status: None   Collection Time: 02/28/15 10:32 AM  Result Value Ref Range Status   Specimen Description BLOOD RIGHT ARM  Final   Special Requests AER 5CC ANA 5CC  Final   Culture NO GROWTH 5 DAYS  Final   Report Status 03/05/2015 FINAL  Final  Culture, blood (routine x 2)     Status: None   Collection Time: 02/28/15 10:43 AM  Result Value Ref Range Status   Specimen Description BLOOD RIGHT ARM  Final   Special Requests AER 5CC ANA 5CC  Final   Culture NO GROWTH 5 DAYS  Final   Report Status 03/05/2015 FINAL  Final  Urine culture     Status: None   Collection Time: 02/28/15 11:33 AM  Result Value  Ref Range Status   Specimen Description URINE, CLEAN CATCH  Final   Special Requests Immunocompromised  Final   Culture NO GROWTH 2 DAYS  Final   Report Status 03/02/2015 FINAL  Final  Culture, routine-abscess     Status: None (Preliminary result)   Collection Time: 03/03/15 12:45 PM  Result Value Ref Range Status   Specimen Description ABDOMEN  Final   Special Requests Normal  Final   Gram Stain MANY GRAM NEGATIVE RODS MANY WBC SEEN   Final   Culture HEAVY GROWTH ESCHERICHIA COLI  Final   Report Status PENDING  Incomplete   Organism ID, Bacteria ESCHERICHIA COLI  Final      Susceptibility   Escherichia coli - MIC*    AMPICILLIN <=2 SENSITIVE Sensitive      CEFAZOLIN <=4 SENSITIVE Sensitive     CEFTRIAXONE <=1 SENSITIVE Sensitive     CIPROFLOXACIN <=0.25 SENSITIVE Sensitive     GENTAMICIN <=1 SENSITIVE Sensitive     IMIPENEM <=0.25 SENSITIVE Sensitive     NITROFURANTOIN <=16 SENSITIVE Sensitive     TRIMETH/SULFA <=20 SENSITIVE Sensitive     PIP/TAZO Value in next row Sensitive      SENSITIVE<=4    * HEAVY GROWTH ESCHERICHIA COLI  Anaerobic culture     Status: None (Preliminary result)   Collection Time: 03/03/15 12:45 PM  Result Value Ref Range Status   Specimen Description ABDOMEN  Final   Special Requests NONE  Final   Culture HOLDING FOR POSSIBLE ANAEROBE  Final   Report Status PENDING  Incomplete    Studies/Results: Dg Chest 1 View  03/05/2015   CLINICAL DATA:  PICC line the chest.  EXAM: CHEST 1 VIEW  COMPARISON:  03/05/2015.  FINDINGS: PICC line has been readjusted, the tip previously appear to course into the jugular. The tip appears to be of the jugular put the tip remains coiled in the left brachycephalic vein region. Continued readjustment should be considered. Left base subsegmental atelectasis. Heart size normal. No pneumothorax. No acute bony abnormality.  IMPRESSION: PICC line tip appears to be coiled in the brachycephalic vein. These results will be called to the ordering clinician or representative by the Radiologist Assistant, and communication documented in the PACS or zVision Dashboard.   Electronically Signed   By: Maisie Fus  Register   On: 03/05/2015 14:27   Dg Chest 1 View  03/05/2015   CLINICAL DATA:  PICC line placement  EXAM: CHEST 1 VIEW  COMPARISON:  CT the chest, abdomen and pelvis- 02/26/2015; CT-guided left percutaneous drainage catheter placement - 03/03/2015  FINDINGS: Left upper extremity approach PICC line is coiled upon itself with tip projected over the expected location of the caudal aspect of the left internal jugular vein.  Normal cardiac silhouette and mediastinal contours given slightly reduced lung volumes.  Minimal left basilar heterogeneous opacities. Trace left-sided pleural effusion. No evidence of edema. No pneumothorax. The lungs appear hyperexpanded with flattening the bilateral main diaphragms and thinning of the biapical pulmonary parenchyma.  A percutaneous drainage catheter overlies the left upper abdominal quadrant. No definite acute osseous abnormalities.  IMPRESSION: 1. Malpositioned left upper extremity approach PICC line with tip projecting over the expected location of the caudal aspect of the left internal jugular vein. Repositioning is advised. 2. Small left-sided pleural effusion and left basilar atelectasis, potentially reactive secondary to known inflammatory/infectious process within the left upper abdominal quadrant.   Electronically Signed   By: Simonne Come M.D.   On: 03/05/2015 14:12  Assessment/Plan: Keland Peyton is a 59 y.o. male with abd mass/abscess, fevers, leukocytosis and wt loss. 9/29 had EGD with extrinsic compression of his stomach but no masses within and no ulcers. Had drain placed and cx is growing E coli.  Anaerobic cx pending. HIV negative It is unclear whether or not this is a malignancy with superinfection or just an abscess.  Recommendations Change zosyn to unasyn PICC Agree with transfer to [facility] for surgical eval as unlikely to resolve with perc drainage alone. Thank you very much for allowing me to participate in the care of this patient. Please call with questions. Diann Bangerter   03/05/2015, 2:43 PM

## 2015-03-05 NOTE — Discharge Summary (Signed)
[facility] [place] [facility]   PATIENT NAME: William Marquez    MR#:  [id number]  DATE OF BIRTH:  [birth year]  DATE OF ADMISSION:  02/26/2015 ADMITTING PHYSICIAN: Auburn Bilberry, MD  DATE OF DISCHARGE: 03/05/15  PRIMARY CARE PHYSICIAN: No PCP Per Patient    ADMISSION DIAGNOSIS:  Gastric Mass Abd mass on CT.  DISCHARGE DIAGNOSIS:  Multiloculated complex left upper quadrant abdominal mass suspect Abscess with unknown origin  SECONDARY DIAGNOSIS:   Past Medical History  Diagnosis Date  . Headache     HOSPITAL COURSE:  59 y.o. male with a known history of irregular heartbeat since childhood, also migraine headaches on heavy ibuprofen therapy for the past 2 years who comes to the hospital with complaints of abdominal pains  1. Sepsis with fever and leukocytosis due to complex multiloculated left abdominal mass/abscess Blood cultures 2 and urine culture and sensitivity negative Patient is on Zosyn IV Chest x-ray normal  2.Left upper quadrant abdominal mass/abscess CT abd reveals 14/6/7 cm complex, Loculated, cystic mass -s/p CT guided percutaneous bx/drainage of fluid/biopsy -Fluid culture Heavy growth of Escherichia coli. -Cytology c/w abscess S/p Gastroenterologist eval and had EGD 9/29 with extrinsic gastric body compression, no sample collected -Oncology consult input noted. -appreciated  Dr. Sampson Goon infectious disease input  2. Hyponatremia, likely dehydration related. Sodium level significant improved to 134 with IV fluids.  D/c ivf -Resolved 3.nicotine dependence- counselled pt to quit smoking for 3-5 min . Provide nicotine patch  4. ETOH dependence - no s/o withdrawal  Pt is agreeable to go to [facility] with dr Terance Hart (surgery) Dr Cherly Hensen (hopsitalist0 is the accepting MD  CONSULTS OBTAINED:  Treatment Team:  Natale Lay, MD Janese Banks, MD Clydie Braun, MD  DRUG ALLERGIES:  No Known Allergies  DISCHARGE  MEDICATIONS:   Current Discharge Medication List    CONTINUE these medications which have NOT CHANGED   Details  ibuprofen (ADVIL,MOTRIN) 200 MG tablet Take 200 mg by mouth every 6 (six) hours as needed.    oxyCODONE-acetaminophen (ROXICET) 5-325 MG tablet Take 1 tablet by mouth every 8 (eight) hours as needed for moderate pain or severe pain (Do not drive or operate heavy machinery while taking as can cause drowsiness.). Qty: 10 tablet, Refills: 0        If you experience worsening of your admission symptoms, develop shortness of breath, life threatening emergency, suicidal or homicidal thoughts you must seek medical attention immediately by calling 911 or calling your MD immediately  if symptoms less severe.  You Must read complete instructions/literature along with all the possible adverse reactions/side effects for all the Medicines you take and that have been prescribed to you. Take any new Medicines after you have completely understood and accept all the possible adverse reactions/side effects.   Please note  You were cared for by a hospitalist during your hospital stay. If you have any questions about your discharge medications or the care you received while you were in the hospital after you are discharged, you can call the unit and asked to speak with the hospitalist on call if the hospitalist that took care of you is not available. Once you are discharged, your primary care physician will handle any further medical issues. Please note that NO REFILLS for any discharge medications will be authorized once you are discharged, as it is imperative that you return to your primary care physician (or establish a relationship with a primary care physician if you do not have  one) for your aftercare needs so that they can reassess your need for medications and monitor your lab values. Today   SUBJECTIVE  Doing ok   VITAL SIGNS:  Blood pressure 99/62, pulse 111, temperature 97.9 F (36.6  C), temperature source Oral, resp. rate 22, height  (1.753 m), weight 78.019 kg (172 lb), SpO2 95 %.  I/O:   Intake/Output Summary (Last 24 hours) at 03/05/15 1252 Last data filed at 03/05/15 1003  Gross per 24 hour  Intake    650 ml  Output      1 ml  Net    649 ml    PHYSICAL EXAMINATION:  GENERAL:  59 y.o.-year-old patient lying in the bed with no acute distress.  EYES: Pupils equal, round, reactive to light and accommodation. No scleral icterus. Extraocular muscles intact.  HEENT: Head atraumatic, normocephalic. Oropharynx and nasopharynx clear.  NECK:  Supple, no jugular venous distention. No thyroid enlargement, no tenderness.  LUNGS: Normal breath sounds bilaterally, no wheezing, rales,rhonchi or crepitation. No use of accessory muscles of respiration.  CARDIOVASCULAR: S1, S2 normal. No murmurs, rubs, or gallops.  ABDOMEN: Soft, non-tender, non-distended. Bowel sounds present. No organomegaly or mass.  EXTREMITIES: No pedal edema, cyanosis, or clubbing.  NEUROLOGIC: Cranial nerves II through XII are intact. Muscle strength 5/5 in all extremities. Sensation intact. Gait not checked.  PSYCHIATRIC: The patient is alert and oriented x 3.  SKIN: No obvious rash, lesion, or ulcer.   DATA REVIEW:   CBC   Recent Labs Lab 03/05/15 0520  WBC 9.1  HGB 12.7*  HCT 38.7*  PLT 470*    Chemistries   Recent Labs Lab 03/05/15 0520  NA 135  K 4.8  CL 98*  CO2 28  GLUCOSE 147*  BUN 11  CREATININE 0.94  CALCIUM 9.6    Microbiology Results   Recent Results (from the past 240 hour(s))  Culture, blood (routine x 2)     Status: None   Collection Time: 02/28/15 10:32 AM  Result Value Ref Range Status   Specimen Description BLOOD RIGHT ARM  Final   Special Requests AER 5CC ANA 5CC  Final   Culture NO GROWTH 5 DAYS  Final   Report Status 03/05/2015 FINAL  Final  Culture, blood (routine x 2)     Status: None   Collection Time: 02/28/15 10:43 AM  Result Value Ref Range  Status   Specimen Description BLOOD RIGHT ARM  Final   Special Requests AER 5CC ANA 5CC  Final   Culture NO GROWTH 5 DAYS  Final   Report Status 03/05/2015 FINAL  Final  Urine culture     Status: None   Collection Time: 02/28/15 11:33 AM  Result Value Ref Range Status   Specimen Description URINE, CLEAN CATCH  Final   Special Requests Immunocompromised  Final   Culture NO GROWTH 2 DAYS  Final   Report Status 03/02/2015 FINAL  Final  Culture, routine-abscess     Status: None (Preliminary result)   Collection Time: 03/03/15 12:45 PM  Result Value Ref Range Status   Specimen Description ABDOMEN  Final   Special Requests Normal  Final   Gram Stain MANY GRAM NEGATIVE RODS MANY WBC SEEN   Final   Culture HEAVY GROWTH ESCHERICHIA COLI  Final   Report Status PENDING  Incomplete   Organism ID, Bacteria ESCHERICHIA COLI  Final      Susceptibility   Escherichia coli - MIC*    AMPICILLIN <=2 SENSITIVE Sensitive  CEFAZOLIN <=4 SENSITIVE Sensitive     CEFTRIAXONE <=1 SENSITIVE Sensitive     CIPROFLOXACIN <=0.25 SENSITIVE Sensitive     GENTAMICIN <=1 SENSITIVE Sensitive     IMIPENEM <=0.25 SENSITIVE Sensitive     NITROFURANTOIN <=16 SENSITIVE Sensitive     TRIMETH/SULFA <=20 SENSITIVE Sensitive     PIP/TAZO Value in next row Sensitive      SENSITIVE<=4    * HEAVY GROWTH ESCHERICHIA COLI  Anaerobic culture     Status: None (Preliminary result)   Collection Time: 03/03/15 12:45 PM  Result Value Ref Range Status   Specimen Description ABDOMEN  Final   Special Requests NONE  Final   Culture HOLDING FOR POSSIBLE ANAEROBE  Final   Report Status PENDING  Incomplete    RADIOLOGY:  Ct Biopsy  03/03/2015   CLINICAL DATA:  Multiloculated left upper quadrant fluid collection  EXAM: CT GUIDED ASPIRATION AND SUBSEQUENT DRAINAGE OF LEFT MID ABDOMINAL FLUID COLLECTION  ANESTHESIA/SEDATION: None  PROCEDURE: The procedure risks, benefits, and alternatives were explained to the patient. Questions  regarding the procedure were encouraged and answered. The patient understands and consents to the procedure.  The left mid abdomen was prepped with chlorhexidinein a sterile fashion, and a sterile drape was applied covering the operative field. A sterile gown and sterile gloves were used for the procedure. Local anesthesia was provided with 1% Lidocaine.  Abdomen utilizing CT fluoroscopic guidance an 18 gauge trocar needle was placed into 1 of the loculated collections in the left mid abdomen along the left pericolic gutter. Aspiration was performed and yielded purulent fluid which was not malodorous. Given anterior in nature of the fluid it was felt a drainage catheter showed the placed. A Bentson guidewire was then placed through the needle and coiled within the collection. Over this, an 8 [place] multipurpose drain was placed and attached to a suction grenade. This was sutured into place utilizing 0 Prolene. The patient tolerated the procedure well and was returned his room in satisfactory condition.  COMPLICATIONS: None immediate  FINDINGS: Persistent multiloculated fluid collection in the left abdomen. A drainage catheter was placed as described above.  IMPRESSION: Successful placement of an 8 French drainage catheter in the left mid abdominal fluid collection. Purulent fluid was withdrawn which was sent for laboratory evaluation. Correlation with the amount of drainage over time is recommended as well as with the results of the culture and Gram stain. If no significant drainage is noted and the culture results are negative, the tube can be removed. Followup scanning would be helpful prior to tube removal. These findings were discussed with Dr. Allena Katz.   Electronically Signed   By: Alcide Clever M.D.   On: 03/03/2015 14:17     Management plans discussed with the patient, family and they are in agreement.  CODE STATUS:     Code Status Orders        Start     Ordered   03/03/15 1337  Full code    Continuous     03/03/15 1336      TOTAL TIME TAKING CARE OF THIS PATIENT: 40 minutes.    Laysha Childers M.D on 03/05/2015 at 12:52 PM  Between 7am to 6pm - Pager - [phone] After 6pm go to [email or web address] - password [email or web address] [facility]  [place] [facility]  Office  [phone]  CC: Primary care physician; No PCP Per Patient

## 2015-03-05 NOTE — Progress Notes (Signed)
PICC line will be placed prior to transfer to [facility] since the IV team just arrived.

## 2015-03-05 NOTE — Progress Notes (Signed)
MD making rounds. PICC line ordered and placed. Discharge to [facility] order received. Bed Assignment received by [facility]. Patient will be transferring to Room 5307. Discharge paperwork facilitated for transport to [facility] by EMS. Report called to Doyce Para, RN at [facility]. No unanswered questions. EMS arranged for transport. Belongings sent with patient and EMS.

## 2015-03-05 NOTE — Plan of Care (Signed)
Problem: Discharge Progression Outcomes Goal: Discharge plan in place and appropriate Outcome: Progressing Per Dr Earmon Phoenix note, pt needs tertiary care center to address his abdominal mass.  Goal: Complications resolved/controlled Outcome: Not Progressing Due to current status may need treatment elsewhere.  Goal: Other Discharge Outcomes/Goals Outcome: Progressing Plan of care progress to care 1. Pain: pt taking prn Oxycodone 5 mg for c/o surgical abdominal pain. 2. J P drain had only 1 ml of drainage. Dressing clean, dry and intact to abdominal incision. 3. Activity: Up ad lib independently. 4. Other discharge outcomes: May need to receive more advanced treatment at tertiary center per surgeon.

## 2015-03-06 LAB — CULTURE, ROUTINE-ABSCESS: Special Requests: NORMAL

## 2015-03-07 LAB — ANAEROBIC CULTURE

## 2016-01-06 IMAGING — CT CT ABD-PELV W/ CM
2 of 5 series · 14 of 46 positions shown, 16 images · IV contrast (omnipaque)
Comparison: None.

CLINICAL DATA: Epigastric pain, left lower quadrant pain, left
upper quadrant pain, elevated WBC

EXAM:
CT CHEST, ABDOMEN, AND PELVIS WITH CONTRAST
TECHNIQUE: Multidetector CT imaging of the chest, abdomen and pelvis was
performed following the standard protocol during bolus
administration of intravenous contrast.
CONTRAST:  100mL OMNIPAQUE IOHEXOL 300 MG/ML  SOLN

[Series 2: cap with · axial · 0.76mm/px · z∈[-970,-350]mm · 11 of 142 slices shown, 13 images]
[im 9/142  soft-tissue]
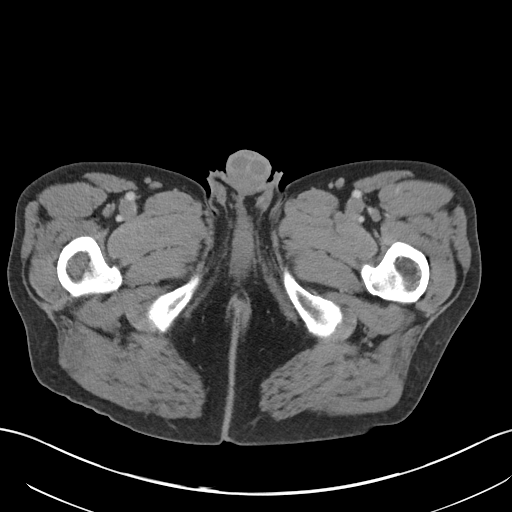
[im 9/142  bone]
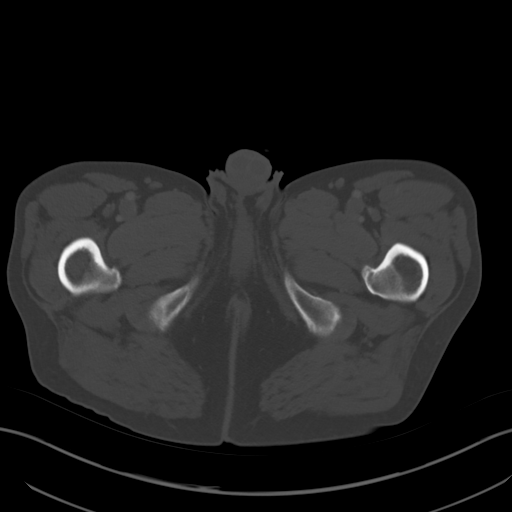
[im 25/142  soft-tissue]
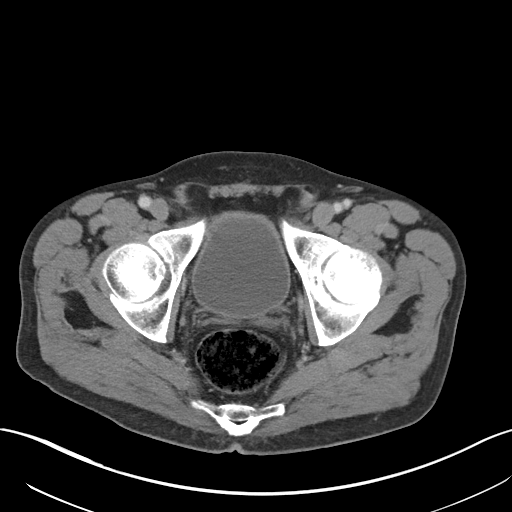
[im 34/142  soft-tissue]
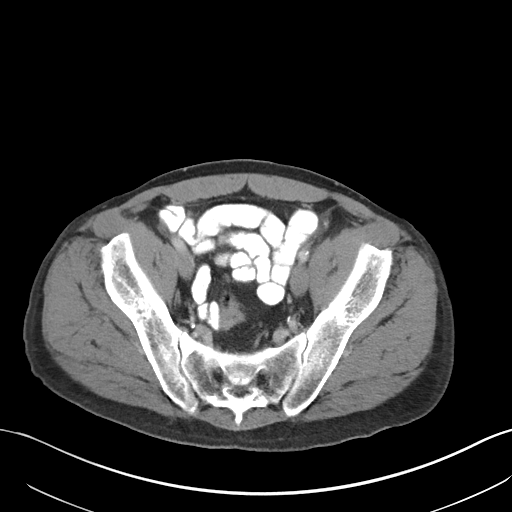
[im 50/142  soft-tissue]
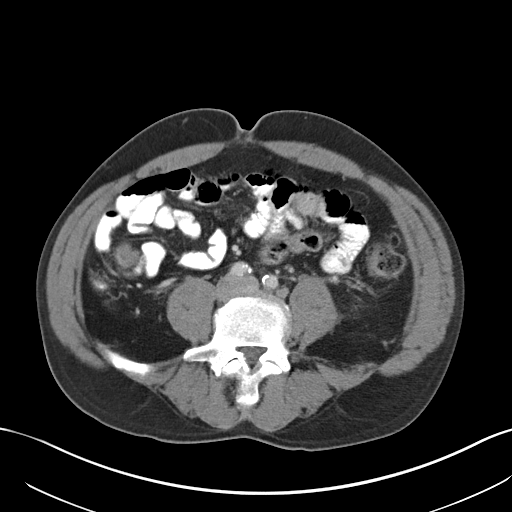
[im 59/142  soft-tissue]
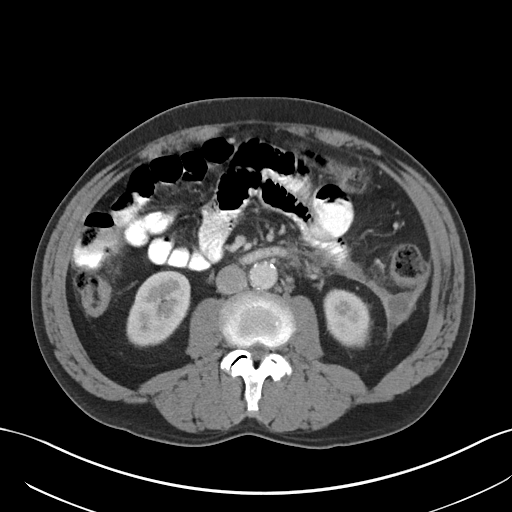
[im 75/142  soft-tissue]
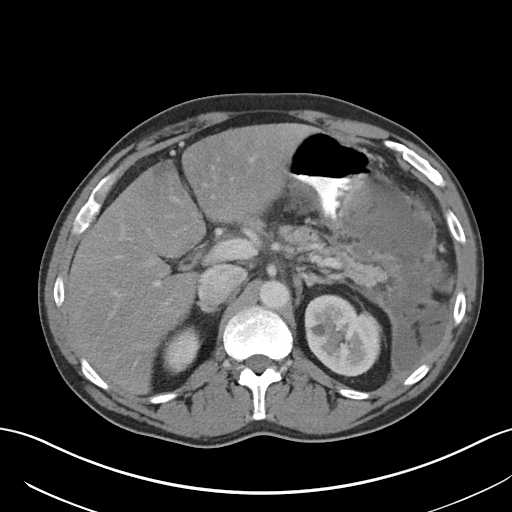
[im 83/142  soft-tissue]
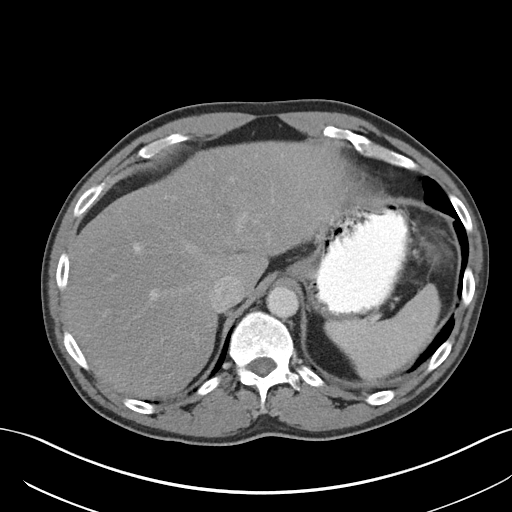
[im 92/142  soft-tissue]
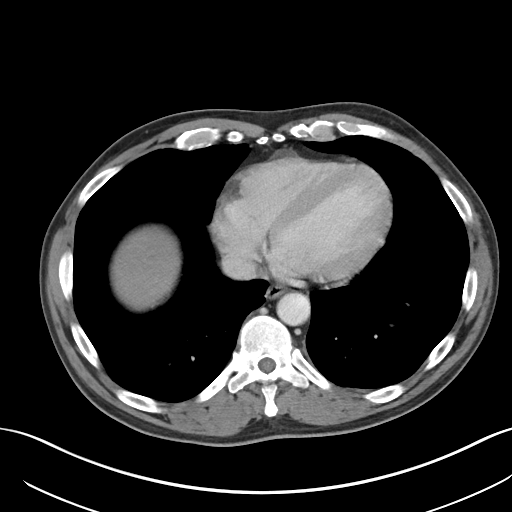
[im 108/142  soft-tissue]
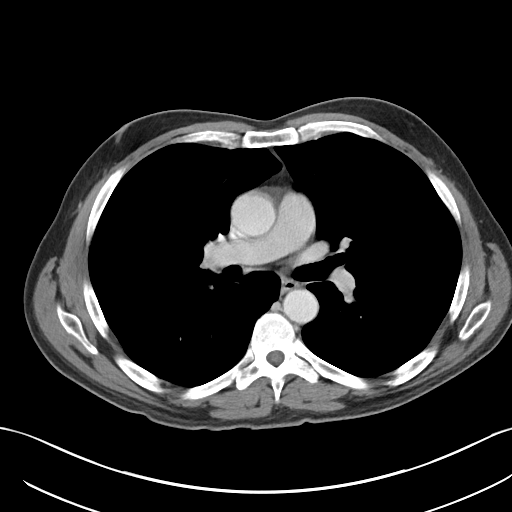
[im 108/142  bone]
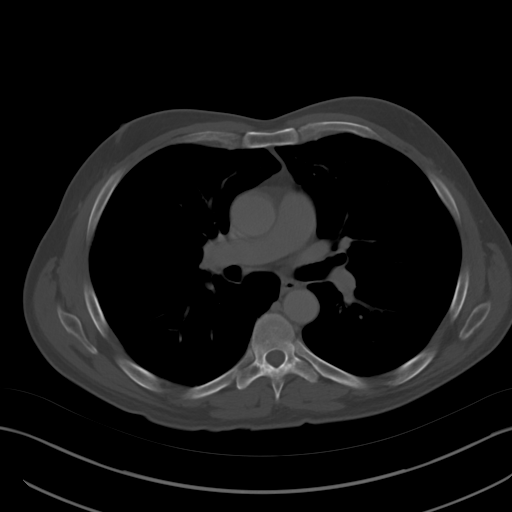
[im 117/142  soft-tissue]
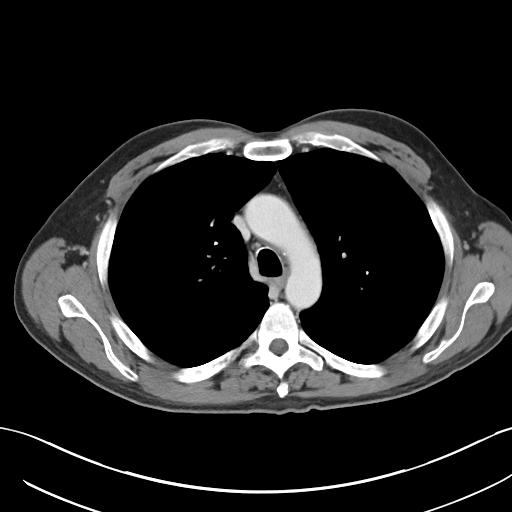
[im 133/142  soft-tissue]
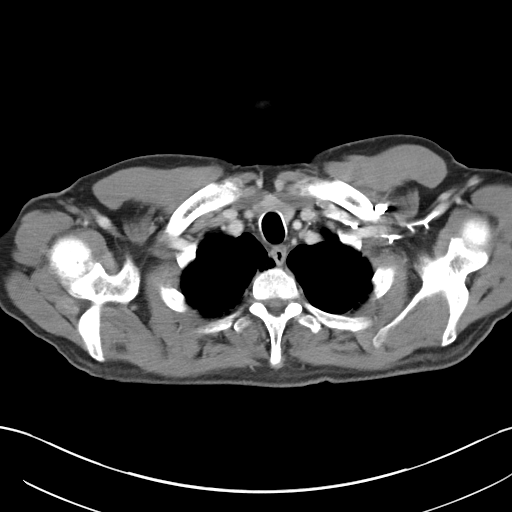

[Series 5: cor cap with · coronal · 0.75mm/px · 3 of 139 slices shown]
[im 47/139  soft-tissue]
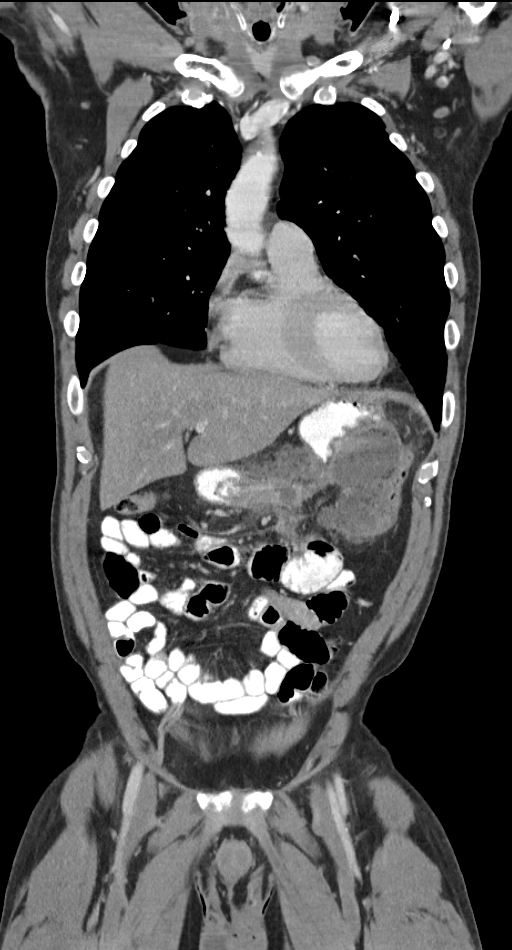
[im 62/139  soft-tissue]
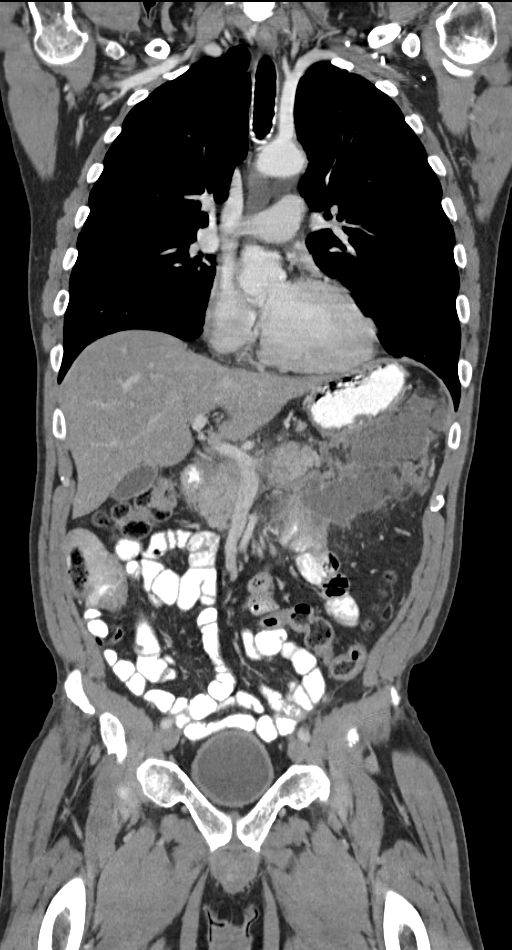
[im 77/139  soft-tissue]
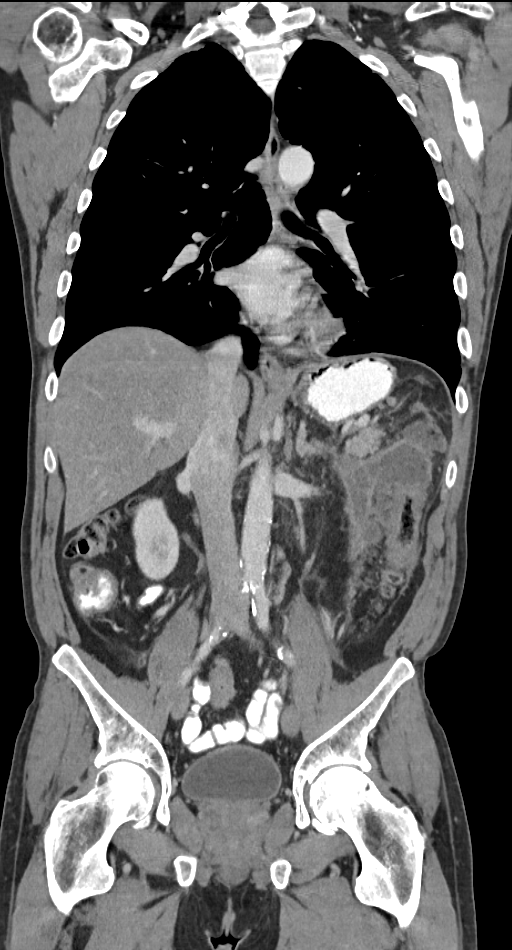

[14 of 46 positions shown; findings below may reference images not displayed]

FINDINGS: CT CHEST FINDINGS

Mediastinum/Nodes: No mediastinal hematoma. No lymphadenopathy.
Normal caliber thoracic aorta. Normal heart size.

Lungs/Pleura: Bilateral centrilobular emphysema. No focal
consolidation, pleural effusion or pneumothorax.

Musculoskeletal: No acute osseous abnormality. No aggressive lytic
or sclerotic osseous lesion. Maintained vertebral body heights and
normal anatomic alignment.

CT ABDOMEN PELVIS FINDINGS

Hepatobiliary: Decreased hepatic echogenicity as can be seen with
hepatic steatosis. More focal area of low attenuation in the
falciform ligament measuring 2 cm likely reflecting a more focal
area of steatosis. No intrahepatic or extrahepatic biliary ductal
dilatation.

Pancreas: Pancreas enhances normally and homogeneously.

Spleen: Normal.

Adrenals/Urinary Tract: Normal adrenal glands. Normal kidneys. No
urolithiasis or obstructive uropathy. Normal bladder.

Stomach/Bowel: No bowel wall thickening or dilatation. No abdominal
or pelvic free fluid. Normal appendix. No pneumatosis,
pneumoperitoneum or portal venous gas.

Vascular/Lymphatic: Normal caliber abdominal aorta with
atherosclerosis. Numerous small non pathologically enlarged
retroperitoneal lymph nodes with the largest measuring 8 mm in short
axis along the left para-aortic region.

Prostate: Enlarged prostate gland measuring 5.7 x 3.6 cm.

Other: There is a complex multiloculated cystic mass in the left
upper quadrant measuring 14.6 x 6.3 x 7.1 cm with peripheral
enhancement. There is a component which partially encases the
gastric antrum.

Musculoskeletal: No acute osseous abnormality. Mild osteoarthritis
of the left hip. No aggressive lytic or sclerotic osseous lesion.
Degenerative disc disease with disc height loss and bilateral facet
arthropathy at L5-S1.
IMPRESSION: 1. Complex multiloculated cystic mass in the left upper quadrant
measuring 14.6 x 6.3 x 7.1 cm with mild peripheral enhancement.
There is a component which partially encases the gastric antrum. The
appearance is nonspecific and may reflect a pseudocyst versus
multiloculated abscess versus cystic malignancy versus sequela of
perforated ulcer. Percutaneous fluid sampling would L delineate
between these etiologies.
2. Hepatic steatosis.

## 2016-01-13 IMAGING — CR DG CHEST 1V
1 series · 2 of 2 positions shown · non-contrast
Comparison: CT the chest, abdomen and pelvis- 02/26/2015; CT-guided
left percutaneous drainage catheter placement - 03/03/2015

CLINICAL DATA: PICC line placement

EXAM:
CHEST 1 VIEW

[Series 1: portable · 0.17mm/px · 2 of 2 slices shown]
[im 1/2]
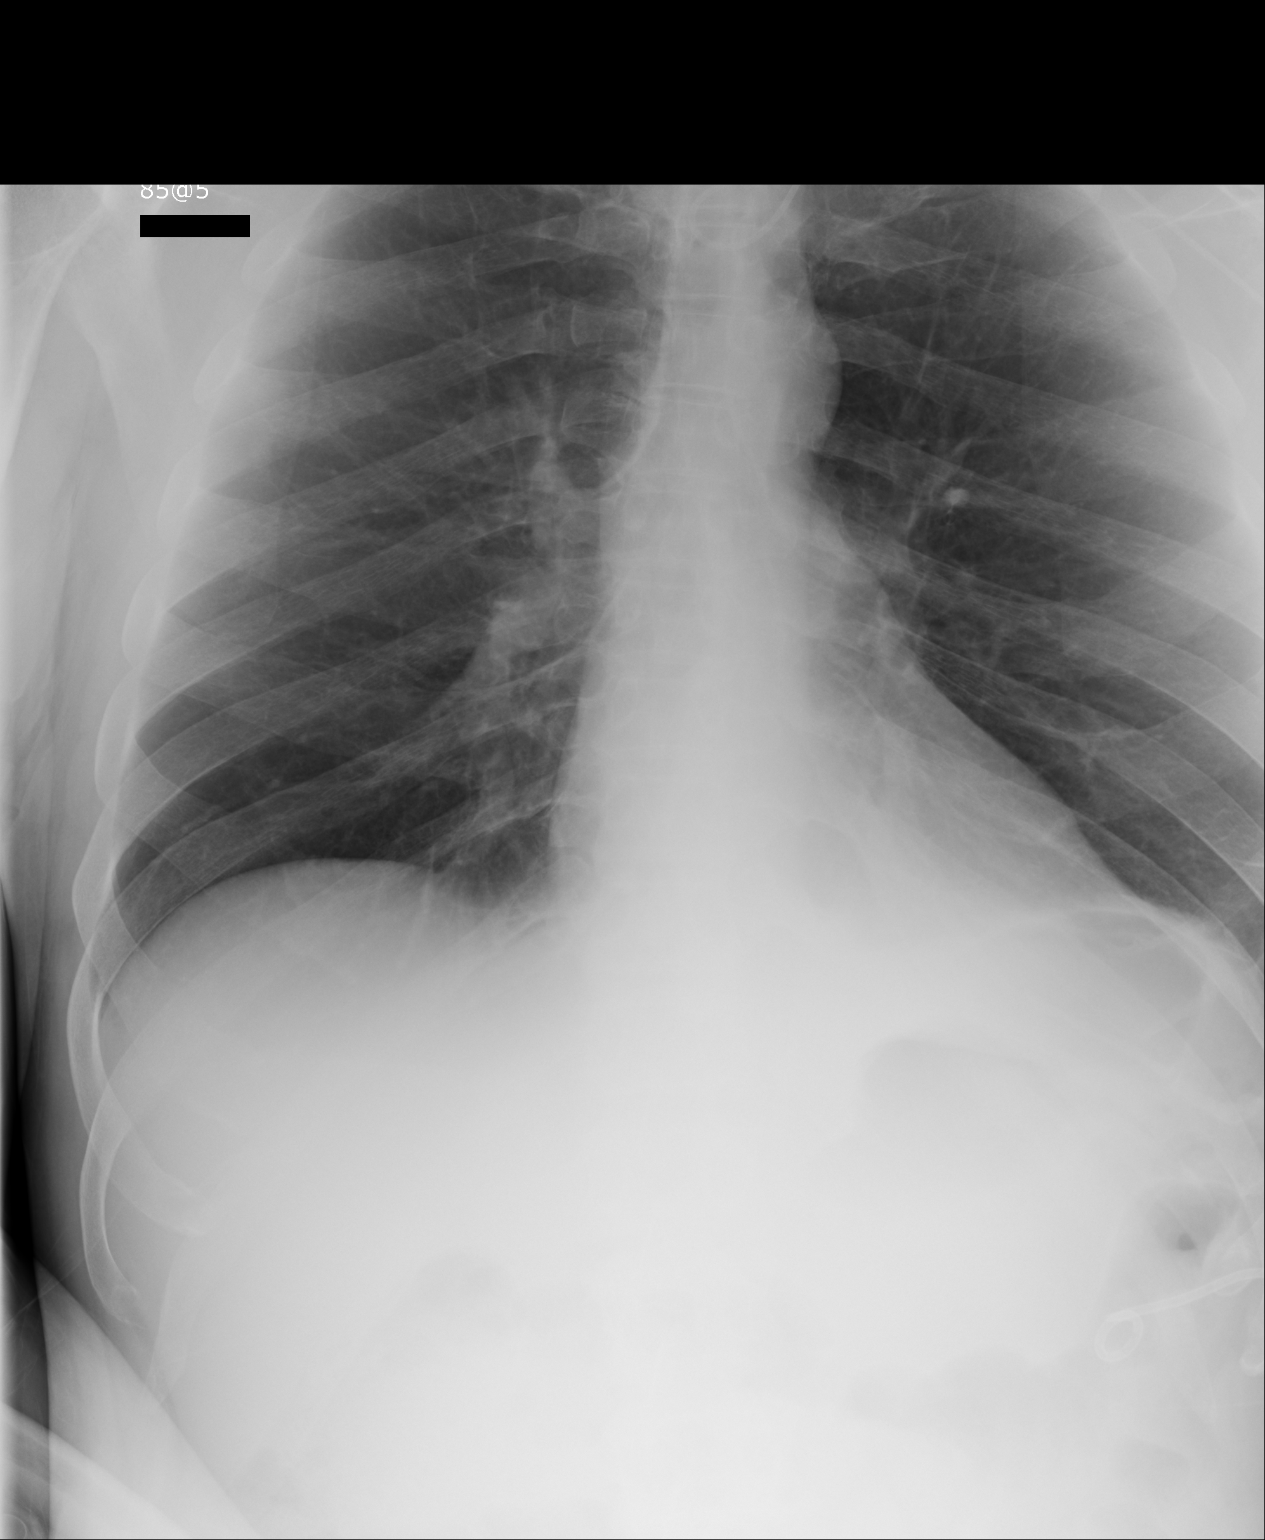
[im 2/2]
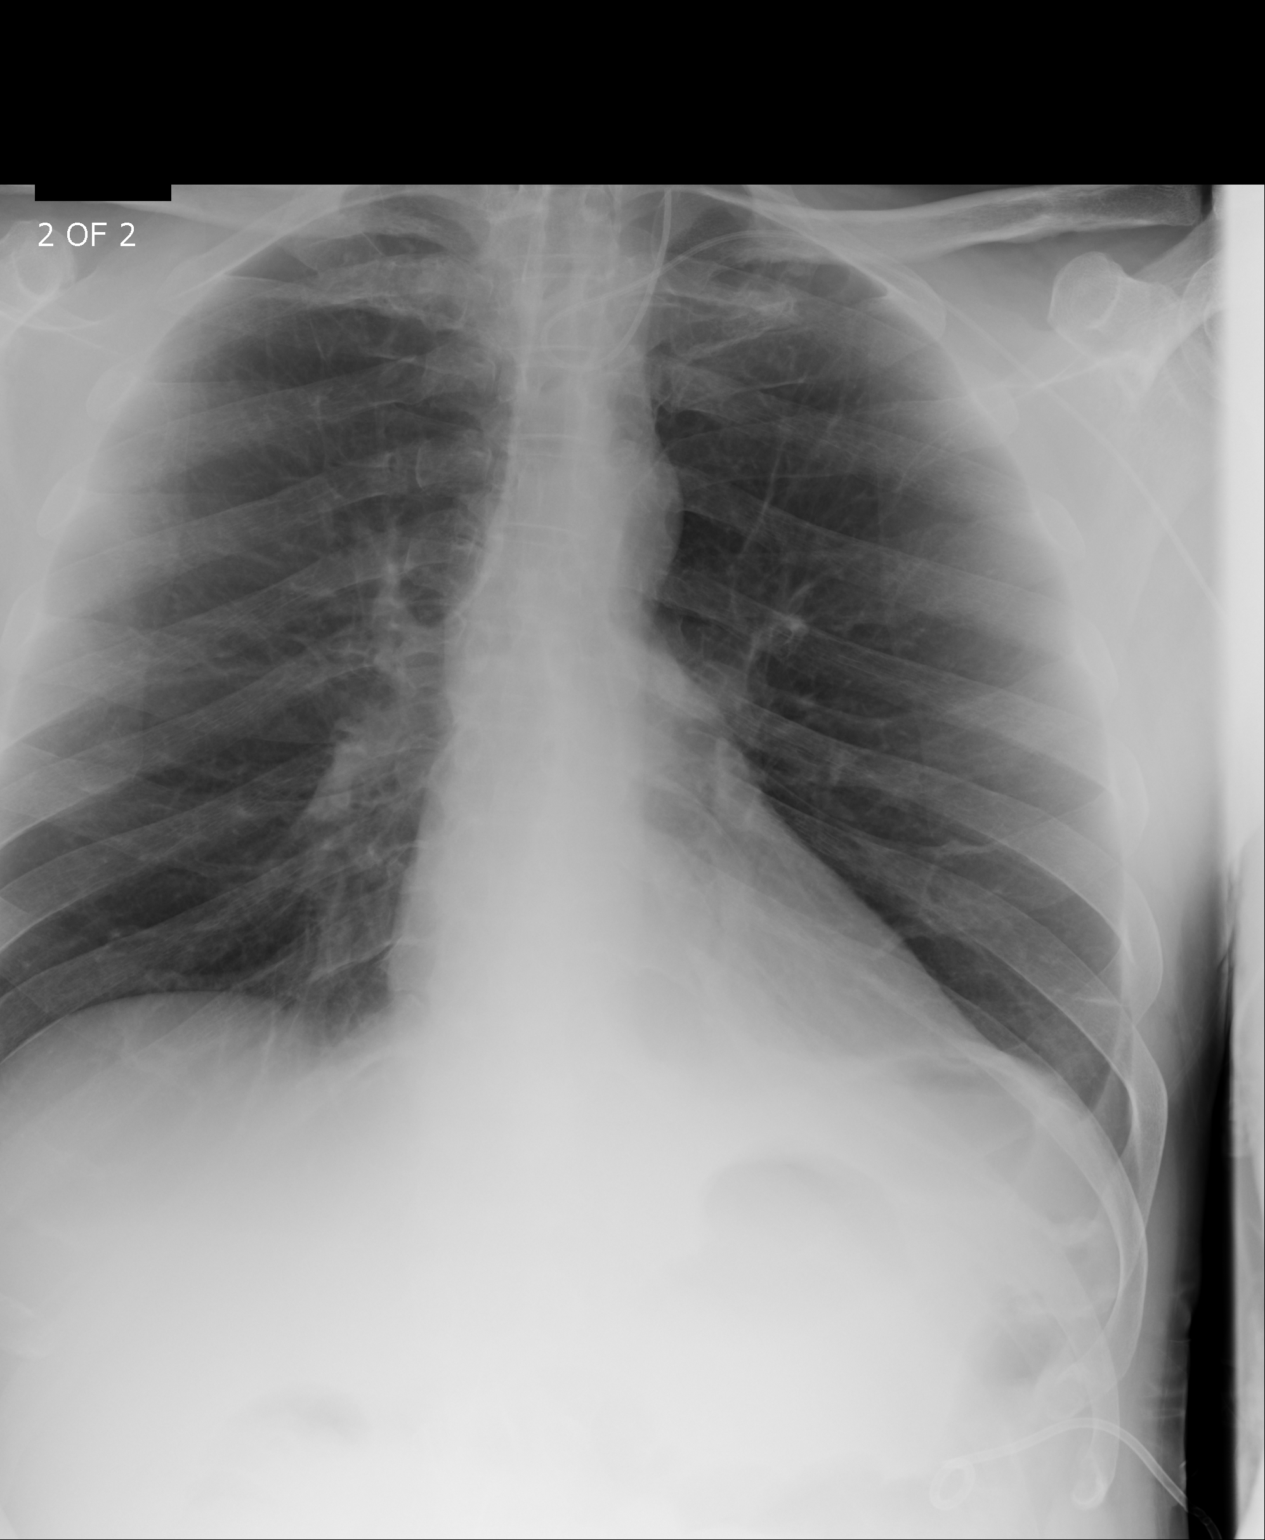

[2 of 2 positions shown; findings below may reference images not displayed]

FINDINGS: Left upper extremity approach PICC line is coiled upon itself with
tip projected over the expected location of the caudal aspect of the
left internal jugular vein.

Normal cardiac silhouette and mediastinal contours given slightly
reduced lung volumes. Minimal left basilar heterogeneous opacities.
Trace left-sided pleural effusion. No evidence of edema. No
pneumothorax. The lungs appear hyperexpanded with flattening the
bilateral main diaphragms and thinning of the biapical pulmonary
parenchyma.

A percutaneous drainage catheter overlies the left upper abdominal
quadrant. No definite acute osseous abnormalities.
IMPRESSION: 1. Malpositioned left upper extremity approach PICC line with tip
projecting over the expected location of the caudal aspect of the
left internal jugular vein. Repositioning is advised.
2. Small left-sided pleural effusion and left basilar atelectasis,
potentially reactive secondary to known inflammatory/infectious
process within the left upper abdominal quadrant.
